# Patient Record
Sex: Female | Born: 1955
Health system: Southern US, Community
[De-identification: ages and names within clinical notes are randomized; demographics above are authoritative.]

## PROBLEM LIST (undated history)

## (undated) DIAGNOSIS — R112 Nausea with vomiting, unspecified: Secondary | ICD-10-CM

## (undated) DIAGNOSIS — C4490 Unspecified malignant neoplasm of skin, unspecified: Secondary | ICD-10-CM

## (undated) DIAGNOSIS — Z9889 Other specified postprocedural states: Secondary | ICD-10-CM

## (undated) DIAGNOSIS — M1990 Unspecified osteoarthritis, unspecified site: Secondary | ICD-10-CM

## (undated) DIAGNOSIS — Z87442 Personal history of urinary calculi: Secondary | ICD-10-CM

## (undated) DIAGNOSIS — J189 Pneumonia, unspecified organism: Secondary | ICD-10-CM

## (undated) DIAGNOSIS — G4730 Sleep apnea, unspecified: Secondary | ICD-10-CM

## (undated) DIAGNOSIS — Z8489 Family history of other specified conditions: Secondary | ICD-10-CM

## (undated) HISTORY — PX: BACK SURGERY: SHX140

## (undated) HISTORY — PX: TONSILLECTOMY: SUR1361

## (undated) HISTORY — PX: OTHER SURGICAL HISTORY: SHX169

---

## 2001-03-14 DIAGNOSIS — G4730 Sleep apnea, unspecified: Secondary | ICD-10-CM

## 2001-03-14 HISTORY — DX: Sleep apnea, unspecified: G47.30

## 2002-03-14 HISTORY — PX: NASAL SINUS SURGERY: SHX719

## 2004-03-14 HISTORY — PX: ABDOMINAL HYSTERECTOMY: SHX81

## 2004-03-14 HISTORY — PX: KNEE CARTILAGE SURGERY: SHX688

## 2014-03-14 HISTORY — PX: MENISCUS REPAIR: SHX5179

## 2017-03-14 HISTORY — PX: REPLACEMENT TOTAL KNEE: SUR1224

## 2018-11-26 ENCOUNTER — Other Ambulatory Visit: Payer: Self-pay | Admitting: Surgical

## 2018-11-26 DIAGNOSIS — Z96651 Presence of right artificial knee joint: Secondary | ICD-10-CM

## 2018-12-14 ENCOUNTER — Other Ambulatory Visit: Payer: Self-pay

## 2018-12-14 ENCOUNTER — Encounter (HOSPITAL_COMMUNITY)
Admission: RE | Admit: 2018-12-14 | Discharge: 2018-12-14 | Disposition: A | Discharge status: Home / Self Care | Payer: Medicare Other | Source: Ambulatory Visit | Attending: Surgical | Admitting: Surgical

## 2018-12-14 DIAGNOSIS — Z96651 Presence of right artificial knee joint: Secondary | ICD-10-CM

## 2018-12-14 MED ORDER — TECHNETIUM TC 99M MEDRONATE IV KIT
20.0000 | PACK | Freq: Once | INTRAVENOUS | Status: AC | PRN
  Administered 2018-12-14: 20 via INTRAVENOUS

## 2019-01-31 NOTE — Progress Notes (Signed)
PCP - Lanelle Bal PA Cardiologist -   Chest x-ray -  EKG - 02/04/2019 Stress Test -  ECHO -  Cardiac Cath - 2019 (done in [place])  Sleep Study - 2003 (done in [place]) CPAP - does not use  Fasting Blood Sugar -  Checks Blood Sugar _____ times a day  Blood Thinner Instructions: Aspirin Instructions: Last Dose:  Anesthesia review:   Patient denies shortness of breath, fever, cough and chest pain at PAT appointment   Patient verbalized understanding of instructions that were given to them at the PAT appointment. Patient was also instructed that they will need to review over the PAT instructions again at home before surgery.

## 2019-01-31 NOTE — Patient Instructions (Addendum)
DUE TO COVID-19 ONLY ONE VISITOR IS ALLOWED TO COME WITH YOU AND STAY IN THE WAITING ROOM ONLY DURING PRE OP AND PROCEDURE DAY OF SURGERY. THE 1 VISITOR MAY VISIT WITH YOU AFTER SURGERY IN YOUR PRIVATE ROOM DURING VISITING HOURS ONLY!  YOU NEED TO HAVE A COVID 19 TEST ON_Saturday 11/21/2020______ @_______ , THIS TEST MUST BE DONE BEFORE SURGERY, COME  Deanna Dorsey, [address] [place] , [address].  ([place]) ONCE YOUR COVID TEST IS COMPLETED, PLEASE BEGIN THE QUARANTINE INSTRUCTIONS AS OUTLINED IN YOUR HANDOUT.                Deanna Dorsey    Your procedure is scheduled on: Wednesday 02/06/2019   Report to [facility] Main  Entrance    Report to admitting at 1:20pm     Call this number if you have problems the morning of surgery [phone]    Remember: Do not eat food after Midnight.   BRUSH YOUR TEETH MORNING OF SURGERY AND RINSE YOUR MOUTH OUT, NO CHEWING GUM CANDY OR MINTS.  NO SOLID FOOD AFTER MIDNIGHT THE NIGHT PRIOR TO SURGERY. NOTHING BY MOUTH EXCEPT CLEAR LIQUIDS UNTIL 12:50 pm.   PLEASE FINISH ENSURE DRINK PER SURGEON ORDER  WHICH NEEDS TO BE COMPLETED AT 12:50 pm.   CLEAR LIQUID DIET   Foods Allowed                                                                     Foods Excluded  Coffee and tea, regular and decaf                             liquids that you cannot  Plain Jell-O any favor except red or purple             see through such as: Fruit ices (not with fruit pulp)                                     milk, soups, orange juice  Iced Popsicles                                    All solid food Carbonated beverages, regular and diet                                    Cranberry, grape and apple juices Sports drinks like Gatorade Lightly seasoned clear broth or consume(fat free) Sugar, honey syrup  Sample Menu Breakfast                                Lunch                                     Supper Cranberry juice  Beef broth                            Chicken broth Jell-O                                     Grape juice                           Apple juice Coffee or tea                        Jell-O                                      Popsicle                                                Coffee or tea                        Coffee or tea  _____________________________________________________________________       Take these medicines the morning of surgery with A SIP OF WATER: NONE                                You may not have any metal on your body including hair pins and              piercings  Do not wear jewelry, make-up, lotions, powders or perfumes, deodorant             Do not wear nail polish on your fingernails.  Do not shave  48 hours prior to surgery.                 Do not bring valuables to the hospital. [place].  Contacts, dentures or bridgework may not be worn into surgery.  Leave suitcase in the car. After surgery it may be brought to your room.                Please read over the following fact sheets you were given: _____________________________________________________________________             [facility] - Preparing for Surgery Before surgery, you can play an important role.  Because skin is not sterile, your skin needs to be as free of germs as possible.  You can reduce the number of germs on your skin by washing with CHG (chlorahexidine gluconate) soap before surgery.  CHG is an antiseptic cleaner which kills germs and bonds with the skin to continue killing germs even after washing. Please DO NOT use if you have an allergy to CHG or antibacterial soaps.  If your skin becomes reddened/irritated stop using the CHG and inform your nurse when you arrive at Short Stay. Do not shave (including legs and underarms) for at least 48 hours prior to the first CHG shower.  You may shave your face/neck. Please follow these  instructions carefully:  1.  Shower with CHG Soap the night before surgery and the  morning of Surgery.  2.  If you choose to wash your hair, wash your hair first as usual with your  normal  shampoo.  3.  After you shampoo, rinse your hair and body thoroughly to remove the  shampoo.                            4.  Use CHG as you would any other liquid soap.  You can apply chg directly  to the skin and wash                       Gently with a scrungie or clean washcloth.  5.  Apply the CHG Soap to your body ONLY FROM THE NECK DOWN.   Do not use on face/ open                           Wound or open sores. Avoid contact with eyes, ears mouth and genitals (private parts).                       Wash face,  Genitals (private parts) with your normal soap.             6.  Wash thoroughly, paying special attention to the area where your surgery  will be performed.  7.  Thoroughly rinse your body with warm water from the neck down.  8.  DO NOT shower/wash with your normal soap after using and rinsing off  the CHG Soap.                9.  Pat yourself dry with a clean towel.            10.  Wear clean pajamas.            11.  Place clean sheets on your bed the night of your first shower and do not  sleep with pets. Day of Surgery : Do not apply any lotions/deodorants the morning of surgery.  Please wear clean clothes to the hospital/surgery center.  FAILURE TO FOLLOW THESE INSTRUCTIONS MAY RESULT IN THE CANCELLATION OF YOUR SURGERY PATIENT SIGNATURE_________________________________  NURSE SIGNATURE__________________________________  ________________________________________________________________________   Adam Phenix  An incentive spirometer is a tool that can help keep your lungs clear and active. This tool measures how well you are filling your lungs with each breath. Taking long deep breaths may help reverse or decrease the chance of developing breathing (pulmonary) problems (especially  infection) following:  A long period of time when you are unable to move or be active. BEFORE THE PROCEDURE   If the spirometer includes an indicator to show your best effort, your nurse or respiratory therapist will set it to a desired goal.  If possible, sit up straight or lean slightly forward. Try not to slouch.  Hold the incentive spirometer in an upright position. INSTRUCTIONS FOR USE  1. Sit on the edge of your bed if possible, or sit up as far as you can in bed or on a chair. 2. Hold the incentive spirometer in an upright position. 3. Breathe out normally. 4. Place the mouthpiece in your mouth and seal your lips tightly around it. 5. Breathe in slowly and as deeply as possible, raising the piston or the ball toward the  top of the column. 6. Hold your breath for 3-5 seconds or for as long as possible. Allow the piston or ball to fall to the bottom of the column. 7. Remove the mouthpiece from your mouth and breathe out normally. 8. Rest for a few seconds and repeat Steps 1 through 7 at least 10 times every 1-2 hours when you are awake. Take your time and take a few normal breaths between deep breaths. 9. The spirometer may include an indicator to show your best effort. Use the indicator as a goal to work toward during each repetition. 10. After each set of 10 deep breaths, practice coughing to be sure your lungs are clear. If you have an incision (the cut made at the time of surgery), support your incision when coughing by placing a pillow or rolled up towels firmly against it. Once you are able to get out of bed, walk around indoors and cough well. You may stop using the incentive spirometer when instructed by your caregiver.  RISKS AND COMPLICATIONS  Take your time so you do not get dizzy or light-headed.  If you are in pain, you may need to take or ask for pain medication before doing incentive spirometry. It is harder to take a deep breath if you are having pain. AFTER  USE  Rest and breathe slowly and easily.  It can be helpful to keep track of a log of your progress. Your caregiver can provide you with a simple table to help with this. If you are using the spirometer at home, follow these instructions: [place] IF:   You are having difficultly using the spirometer.  You have trouble using the spirometer as often as instructed.  Your pain medication is not giving enough relief while using the spirometer.  You develop fever of 100.5 F (38.1 C) or higher. SEEK IMMEDIATE MEDICAL CARE IF:   You cough up bloody sputum that had not been present before.  You develop fever of 102 F (38.9 C) or greater.  You develop worsening pain at or near the incision site. MAKE SURE YOU:   Understand these instructions.  Will watch your condition.  Will get help right away if you are not doing well or get worse. Document Released: 07/11/2006 Document Revised: 05/23/2011 Document Reviewed: 09/11/2006 Exit[employer] Patient Information 2014 [employer], [place].   ________________________________________________________________________  WHAT IS A BLOOD TRANSFUSION? Blood Transfusion Information  A transfusion is the replacement of blood or some of its parts. Blood is made up of multiple cells which provide different functions.  Red blood cells carry oxygen and are used for blood loss replacement.  White blood cells fight against infection.  Platelets control bleeding.  Plasma helps clot blood.  Other blood products are available for specialized needs, such as hemophilia or other clotting disorders. BEFORE THE TRANSFUSION  Who gives blood for transfusions?   Healthy volunteers who are fully evaluated to make sure their blood is safe. This is blood bank blood. Transfusion therapy is the safest it has ever been in the practice of medicine. Before blood is taken from a donor, a complete history is taken to make sure that person has no history of diseases  nor engages in risky social behavior (examples are intravenous drug use or sexual activity with multiple partners). The donor's travel history is screened to minimize risk of transmitting infections, such as malaria. The donated blood is tested for signs of infectious diseases, such as HIV and hepatitis. The blood is then tested  to be sure it is compatible with you in order to minimize the chance of a transfusion reaction. If you or a relative donates blood, this is often done in anticipation of surgery and is not appropriate for emergency situations. It takes many days to process the donated blood. RISKS AND COMPLICATIONS Although transfusion therapy is very safe and saves many lives, the main dangers of transfusion include:   Getting an infectious disease.  Developing a transfusion reaction. This is an allergic reaction to something in the blood you were given. Every precaution is taken to prevent this. The decision to have a blood transfusion has been considered carefully by your caregiver before blood is given. Blood is not given unless the benefits outweigh the risks. AFTER THE TRANSFUSION  Right after receiving a blood transfusion, you will usually feel much better and more energetic. This is especially true if your red blood cells have gotten low (anemic). The transfusion raises the level of the red blood cells which carry oxygen, and this usually causes an energy increase.  The nurse administering the transfusion will monitor you carefully for complications. HOME CARE INSTRUCTIONS  No special instructions are needed after a transfusion. You may find your energy is better. Speak with your caregiver about any limitations on activity for underlying diseases you may have. SEEK MEDICAL CARE IF:   Your condition is not improving after your transfusion.  You develop redness or irritation at the intravenous (IV) site. SEEK IMMEDIATE MEDICAL CARE IF:  Any of the following symptoms occur over the  next 12 hours:  Shaking chills.  You have a temperature by mouth above 102 F (38.9 C), not controlled by medicine.  Chest, back, or muscle pain.  People around you feel you are not acting correctly or are confused.  Shortness of breath or difficulty breathing.  Dizziness and fainting.  You get a rash or develop hives.  You have a decrease in urine output.  Your urine turns a dark color or changes to pink, red, or brown. Any of the following symptoms occur over the next 10 days:  You have a temperature by mouth above 102 F (38.9 C), not controlled by medicine.  Shortness of breath.  Weakness after normal activity.  The white part of the eye turns yellow (jaundice).  You have a decrease in the amount of urine or are urinating less often.  Your urine turns a dark color or changes to pink, red, or brown. Document Released: 02/26/2000 Document Revised: 05/23/2011 Document Reviewed: 10/15/2007 [facility] Patient Information 2014 [place], [place].  _______________________________________________________________________

## 2019-02-02 ENCOUNTER — Other Ambulatory Visit (HOSPITAL_COMMUNITY)
Admission: RE | Admit: 2019-02-02 | Discharge: 2019-02-02 | Disposition: A | Discharge status: Home / Self Care | Payer: Medicare Other | Source: Ambulatory Visit | Attending: Orthopedic Surgery | Admitting: Orthopedic Surgery

## 2019-02-02 DIAGNOSIS — Z20828 Contact with and (suspected) exposure to other viral communicable diseases: Secondary | ICD-10-CM | POA: Insufficient documentation

## 2019-02-02 DIAGNOSIS — Z01812 Encounter for preprocedural laboratory examination: Secondary | ICD-10-CM | POA: Diagnosis present

## 2019-02-04 ENCOUNTER — Encounter (HOSPITAL_COMMUNITY)
Admission: RE | Admit: 2019-02-04 | Discharge: 2019-02-04 | Disposition: A | Discharge status: Home / Self Care | Payer: Medicare Other | Source: Ambulatory Visit | Attending: Orthopedic Surgery | Admitting: Orthopedic Surgery

## 2019-02-04 ENCOUNTER — Encounter (HOSPITAL_COMMUNITY): Payer: Self-pay

## 2019-02-04 ENCOUNTER — Other Ambulatory Visit: Payer: Self-pay

## 2019-02-04 DIAGNOSIS — Z01818 Encounter for other preprocedural examination: Secondary | ICD-10-CM | POA: Diagnosis not present

## 2019-02-04 DIAGNOSIS — R9431 Abnormal electrocardiogram [ECG] [EKG]: Secondary | ICD-10-CM | POA: Diagnosis not present

## 2019-02-04 DIAGNOSIS — M24661 Ankylosis, right knee: Secondary | ICD-10-CM | POA: Insufficient documentation

## 2019-02-04 HISTORY — DX: Other specified postprocedural states: Z98.890

## 2019-02-04 HISTORY — DX: Sleep apnea, unspecified: G47.30

## 2019-02-04 HISTORY — DX: Unspecified osteoarthritis, unspecified site: M19.90

## 2019-02-04 HISTORY — DX: Other specified postprocedural states: R11.2

## 2019-02-04 HISTORY — DX: Personal history of urinary calculi: Z87.442

## 2019-02-04 LAB — COMPREHENSIVE METABOLIC PANEL
ALT: 20 U/L (ref 0–44)
AST: 19 U/L (ref 15–41)
Albumin: 4.2 g/dL (ref 3.5–5.0)
Alkaline Phosphatase: 54 U/L (ref 38–126)
Anion gap: 7 (ref 5–15)
BUN: 15 mg/dL (ref 8–23)
CO2: 27 mmol/L (ref 22–32)
Calcium: 9.3 mg/dL (ref 8.9–10.3)
Chloride: 106 mmol/L (ref 98–111)
Creatinine, Ser: 0.59 mg/dL (ref 0.44–1.00)
GFR calc Af Amer: >60 mL/min (ref >60)
GFR calc non Af Amer: >60 mL/min (ref >60)
Glucose, Bld: 91 mg/dL (ref 70–99)
Potassium: 4 mmol/L (ref 3.5–5.1)
Sodium: 140 mmol/L (ref 135–145)
Total Bilirubin: 0.6 mg/dL (ref 0.3–1.2)
Total Protein: 7.2 g/dL (ref 6.5–8.1)

## 2019-02-04 LAB — CBC WITH DIFFERENTIAL/PLATELET
Abs Immature Granulocytes: 0.02 10*3/uL (ref 0.00–0.07)
Basophils Absolute: 0 10*3/uL (ref 0.0–0.1)
Basophils Relative: 1 %
Eosinophils Absolute: 0.1 10*3/uL (ref 0.0–0.5)
Eosinophils Relative: 2 %
HCT: 37.7 % (ref 36.0–46.0)
Hemoglobin: 12.3 g/dL (ref 12.0–15.0)
Immature Granulocytes: 1 %
Lymphocytes Relative: 26 %
Lymphs Abs: 1.1 10*3/uL (ref 0.7–4.0)
MCH: 31.6 pg (ref 26.0–34.0)
MCHC: 32.6 g/dL (ref 30.0–36.0)
MCV: 96.9 fL (ref 80.0–100.0)
Monocytes Absolute: 0.3 10*3/uL (ref 0.1–1.0)
Monocytes Relative: 7 %
Neutro Abs: 2.8 10*3/uL (ref 1.7–7.7)
Neutrophils Relative %: 63 %
Platelets: 310 10*3/uL (ref 150–400)
RBC: 3.89 MIL/uL (ref 3.87–5.11)
RDW: 13.2 % (ref 11.5–15.5)
WBC: 4.3 10*3/uL (ref 4.0–10.5)
nRBC: 0 % (ref 0.0–0.2)

## 2019-02-04 LAB — PROTIME-INR
INR: 1 (ref 0.8–1.2)
Prothrombin Time: 12.7 seconds (ref 11.4–15.2)

## 2019-02-04 LAB — NOVEL CORONAVIRUS, NAA (HOSP ORDER, SEND-OUT TO REF LAB; TAT 18-24 HRS): SARS-CoV-2, NAA: NOT DETECTED

## 2019-02-04 LAB — ABO/RH: ABO/RH(D): A POS

## 2019-02-04 LAB — SURGICAL PCR SCREEN
MRSA, PCR: NEGATIVE
Staphylococcus aureus: POSITIVE — AB

## 2019-02-04 LAB — APTT: aPTT: 27 seconds (ref 24–36)

## 2019-02-05 MED ORDER — BUPIVACAINE LIPOSOME 1.3 % IJ SUSP
20.0000 mL | INTRAMUSCULAR | Status: DC
  Filled 2019-02-05: qty 20

## 2019-02-06 ENCOUNTER — Observation Stay (HOSPITAL_COMMUNITY)
Admission: RE | Admit: 2019-02-06 | Discharge: 2019-02-07 | Disposition: A | Discharge status: Home / Self Care | Payer: Medicare Other | Attending: Orthopedic Surgery | Admitting: Orthopedic Surgery

## 2019-02-06 ENCOUNTER — Encounter (HOSPITAL_COMMUNITY)
Admission: RE | Disposition: A | Discharge status: Home / Self Care | Payer: Self-pay | Source: Home / Self Care | Attending: Orthopedic Surgery

## 2019-02-06 ENCOUNTER — Ambulatory Visit (HOSPITAL_COMMUNITY): Payer: Medicare Other | Admitting: Physician Assistant

## 2019-02-06 ENCOUNTER — Encounter (HOSPITAL_COMMUNITY): Payer: Self-pay

## 2019-02-06 ENCOUNTER — Other Ambulatory Visit: Payer: Self-pay

## 2019-02-06 ENCOUNTER — Ambulatory Visit (HOSPITAL_COMMUNITY): Payer: Medicare Other | Admitting: Certified Registered Nurse Anesthetist

## 2019-02-06 DIAGNOSIS — M1990 Unspecified osteoarthritis, unspecified site: Secondary | ICD-10-CM | POA: Insufficient documentation

## 2019-02-06 DIAGNOSIS — Z881 Allergy status to other antibiotic agents status: Secondary | ICD-10-CM | POA: Insufficient documentation

## 2019-02-06 DIAGNOSIS — M24661 Ankylosis, right knee: Secondary | ICD-10-CM | POA: Diagnosis present

## 2019-02-06 DIAGNOSIS — Z96651 Presence of right artificial knee joint: Secondary | ICD-10-CM | POA: Diagnosis not present

## 2019-02-06 DIAGNOSIS — G4730 Sleep apnea, unspecified: Secondary | ICD-10-CM | POA: Insufficient documentation

## 2019-02-06 DIAGNOSIS — Z79899 Other long term (current) drug therapy: Secondary | ICD-10-CM | POA: Diagnosis not present

## 2019-02-06 DIAGNOSIS — E785 Hyperlipidemia, unspecified: Secondary | ICD-10-CM | POA: Diagnosis not present

## 2019-02-06 DIAGNOSIS — T8482XA Fibrosis due to internal orthopedic prosthetic devices, implants and grafts, initial encounter: Secondary | ICD-10-CM | POA: Diagnosis present

## 2019-02-06 HISTORY — PX: KNEE ARTHROTOMY: SHX5881

## 2019-02-06 LAB — TYPE AND SCREEN
ABO/RH(D): A POS
Antibody Screen: NEGATIVE

## 2019-02-06 SURGERY — ARTHROTOMY, KNEE
Anesthesia: Regional | Site: Knee | Laterality: Right

## 2019-02-06 MED ORDER — PROMETHAZINE HCL 25 MG/ML IJ SOLN
6.2500 mg | INTRAMUSCULAR | Status: DC | PRN
  Administered 2019-02-06: 17:00:00 6.25 mg via INTRAVENOUS

## 2019-02-06 MED ORDER — ROPIVACAINE HCL 5 MG/ML IJ SOLN
INTRAMUSCULAR | Status: DC | PRN
  Administered 2019-02-06: 30 mL via PERINEURAL

## 2019-02-06 MED ORDER — PROPOFOL 10 MG/ML IV BOLUS
INTRAVENOUS | Status: AC
  Filled 2019-02-06: qty 20

## 2019-02-06 MED ORDER — METOCLOPRAMIDE HCL 5 MG/ML IJ SOLN: 5.0000 mg | Freq: Three times a day (TID) | INTRAMUSCULAR | Status: DC | PRN

## 2019-02-06 MED ORDER — TRAMADOL HCL 50 MG PO TABS
50.0000 mg | ORAL_TABLET | Freq: Four times a day (QID) | ORAL | Status: DC | PRN
  Administered 2019-02-07: 100 mg via ORAL
  Filled 2019-02-06: qty 2

## 2019-02-06 MED ORDER — OXYCODONE HCL 5 MG/5ML PO SOLN: 5.0000 mg | Freq: Once | ORAL | Status: DC | PRN

## 2019-02-06 MED ORDER — TRAMADOL HCL 50 MG PO TABS: 50.0000 mg | ORAL_TABLET | Freq: Four times a day (QID) | ORAL | 0 refills | Status: DC | PRN

## 2019-02-06 MED ORDER — LIDOCAINE 2% (20 MG/ML) 5 ML SYRINGE
INTRAMUSCULAR | Status: AC
  Filled 2019-02-06: qty 5

## 2019-02-06 MED ORDER — HYDROMORPHONE HCL 1 MG/ML IJ SOLN
0.2500 mg | INTRAMUSCULAR | Status: DC | PRN
  Administered 2019-02-06 (×2): 0.5 mg via INTRAVENOUS

## 2019-02-06 MED ORDER — METHOCARBAMOL 500 MG PO TABS: 500.0000 mg | ORAL_TABLET | Freq: Four times a day (QID) | ORAL | 0 refills | Status: DC | PRN

## 2019-02-06 MED ORDER — PROMETHAZINE HCL 25 MG/ML IJ SOLN
INTRAMUSCULAR | Status: AC
  Administered 2019-02-06: 6.25 mg via INTRAVENOUS
  Filled 2019-02-06: qty 1

## 2019-02-06 MED ORDER — CHLORHEXIDINE GLUCONATE 4 % EX LIQD: 60.0000 mL | Freq: Once | CUTANEOUS | Status: DC

## 2019-02-06 MED ORDER — MEPERIDINE HCL 50 MG/ML IJ SOLN: 6.2500 mg | INTRAMUSCULAR | Status: DC | PRN

## 2019-02-06 MED ORDER — ONDANSETRON HCL 4 MG PO TABS
4.0000 mg | ORAL_TABLET | Freq: Four times a day (QID) | ORAL | Status: DC | PRN
  Administered 2019-02-07: 4 mg via ORAL

## 2019-02-06 MED ORDER — CEFAZOLIN SODIUM-DEXTROSE 1-4 GM/50ML-% IV SOLN
1.0000 g | Freq: Once | INTRAVENOUS | Status: AC
  Administered 2019-02-06: 1 g via INTRAVENOUS
  Filled 2019-02-06: qty 50

## 2019-02-06 MED ORDER — FENTANYL CITRATE (PF) 100 MCG/2ML IJ SOLN
INTRAMUSCULAR | Status: AC
  Filled 2019-02-06: qty 2

## 2019-02-06 MED ORDER — HYDROMORPHONE HCL 1 MG/ML IJ SOLN
INTRAMUSCULAR | Status: AC
  Administered 2019-02-06: 0.5 mg via INTRAVENOUS
  Filled 2019-02-06: qty 1

## 2019-02-06 MED ORDER — KETOROLAC TROMETHAMINE 30 MG/ML IJ SOLN
30.0000 mg | Freq: Once | INTRAMUSCULAR | Status: AC | PRN
  Administered 2019-02-06: 16:00:00 30 mg via INTRAVENOUS

## 2019-02-06 MED ORDER — FENTANYL CITRATE (PF) 100 MCG/2ML IJ SOLN
INTRAMUSCULAR | Status: DC | PRN
  Administered 2019-02-06 (×2): 25 ug via INTRAVENOUS
  Administered 2019-02-06: 50 ug via INTRAVENOUS
  Administered 2019-02-06: 25 ug via INTRAVENOUS
  Administered 2019-02-06 (×2): 50 ug via INTRAVENOUS

## 2019-02-06 MED ORDER — MIDAZOLAM HCL 5 MG/5ML IJ SOLN
INTRAMUSCULAR | Status: DC | PRN
  Administered 2019-02-06: 1 mg via INTRAVENOUS
  Administered 2019-02-06: 2 mg via INTRAVENOUS
  Administered 2019-02-06: 1 mg via INTRAVENOUS

## 2019-02-06 MED ORDER — MIDAZOLAM HCL 2 MG/2ML IJ SOLN
INTRAMUSCULAR | Status: AC
  Filled 2019-02-06: qty 2

## 2019-02-06 MED ORDER — LIDOCAINE 2% (20 MG/ML) 5 ML SYRINGE
INTRAMUSCULAR | Status: DC | PRN
  Administered 2019-02-06: 80 mg via INTRAVENOUS

## 2019-02-06 MED ORDER — ACETAMINOPHEN 10 MG/ML IV SOLN
1000.0000 mg | Freq: Four times a day (QID) | INTRAVENOUS | Status: DC
  Administered 2019-02-06: 1000 mg via INTRAVENOUS
  Filled 2019-02-06: qty 100

## 2019-02-06 MED ORDER — ATORVASTATIN CALCIUM 10 MG PO TABS
10.0000 mg | ORAL_TABLET | Freq: Every day | ORAL | Status: DC
  Administered 2019-02-06: 10 mg via ORAL
  Filled 2019-02-06: qty 1

## 2019-02-06 MED ORDER — METHOCARBAMOL 500 MG IVPB - SIMPLE MED
500.0000 mg | Freq: Four times a day (QID) | INTRAVENOUS | Status: DC | PRN
  Administered 2019-02-06: 16:00:00 500 mg via INTRAVENOUS
  Filled 2019-02-06: qty 50

## 2019-02-06 MED ORDER — DEXMEDETOMIDINE HCL 200 MCG/2ML IV SOLN
INTRAVENOUS | Status: DC | PRN
  Administered 2019-02-06 (×2): 4 ug via INTRAVENOUS

## 2019-02-06 MED ORDER — KETOROLAC TROMETHAMINE 30 MG/ML IJ SOLN
INTRAMUSCULAR | Status: AC
  Administered 2019-02-06: 30 mg via INTRAVENOUS
  Filled 2019-02-06: qty 1

## 2019-02-06 MED ORDER — PROPOFOL 500 MG/50ML IV EMUL
INTRAVENOUS | Status: AC
  Filled 2019-02-06: qty 100

## 2019-02-06 MED ORDER — HYDROCODONE-ACETAMINOPHEN 5-325 MG PO TABS
1.0000 | ORAL_TABLET | ORAL | Status: DC | PRN
  Administered 2019-02-06: 1 via ORAL
  Administered 2019-02-07: 2 via ORAL
  Filled 2019-02-06: qty 1
  Filled 2019-02-06: qty 2

## 2019-02-06 MED ORDER — PROPOFOL 10 MG/ML IV BOLUS
INTRAVENOUS | Status: DC | PRN
  Administered 2019-02-06: 150 mg via INTRAVENOUS
  Administered 2019-02-06: 50 mg via INTRAVENOUS

## 2019-02-06 MED ORDER — DEXAMETHASONE SODIUM PHOSPHATE 10 MG/ML IJ SOLN: 8.0000 mg | Freq: Once | INTRAMUSCULAR | Status: DC

## 2019-02-06 MED ORDER — ASPIRIN 325 MG PO TBEC: 325.0000 mg | DELAYED_RELEASE_TABLET | Freq: Every day | ORAL | 0 refills | Status: AC

## 2019-02-06 MED ORDER — ASPIRIN EC 325 MG PO TBEC
325.0000 mg | DELAYED_RELEASE_TABLET | Freq: Every day | ORAL | Status: DC
  Administered 2019-02-07: 325 mg via ORAL
  Filled 2019-02-06: qty 1

## 2019-02-06 MED ORDER — METOCLOPRAMIDE HCL 5 MG PO TABS: 5.0000 mg | ORAL_TABLET | Freq: Three times a day (TID) | ORAL | Status: DC | PRN

## 2019-02-06 MED ORDER — LACTATED RINGERS IV SOLN
INTRAVENOUS | Status: DC
  Administered 2019-02-06: 14:00:00 via INTRAVENOUS

## 2019-02-06 MED ORDER — ONDANSETRON HCL 4 MG/2ML IJ SOLN
INTRAMUSCULAR | Status: DC | PRN
  Administered 2019-02-06: 4 mg via INTRAVENOUS

## 2019-02-06 MED ORDER — ACETAMINOPHEN 500 MG PO TABS: 1000.0000 mg | ORAL_TABLET | Freq: Once | ORAL | Status: DC

## 2019-02-06 MED ORDER — METHOCARBAMOL 500 MG PO TABS
500.0000 mg | ORAL_TABLET | Freq: Four times a day (QID) | ORAL | Status: DC | PRN
  Administered 2019-02-07: 500 mg via ORAL
  Filled 2019-02-06 (×2): qty 1

## 2019-02-06 MED ORDER — CEFAZOLIN SODIUM-DEXTROSE 2-4 GM/100ML-% IV SOLN
2.0000 g | INTRAVENOUS | Status: AC
  Administered 2019-02-06: 2 g via INTRAVENOUS
  Filled 2019-02-06: qty 100

## 2019-02-06 MED ORDER — ACETAMINOPHEN 500 MG PO TABS
500.0000 mg | ORAL_TABLET | Freq: Four times a day (QID) | ORAL | Status: DC
  Administered 2019-02-06 – 2019-02-07 (×2): 500 mg via ORAL
  Filled 2019-02-06 (×2): qty 1

## 2019-02-06 MED ORDER — ONDANSETRON HCL 4 MG/2ML IJ SOLN: 4.0000 mg | Freq: Four times a day (QID) | INTRAMUSCULAR | Status: DC | PRN

## 2019-02-06 MED ORDER — OXYCODONE HCL 5 MG PO TABS: 5.0000 mg | ORAL_TABLET | Freq: Once | ORAL | Status: DC | PRN

## 2019-02-06 MED ORDER — DEXAMETHASONE SODIUM PHOSPHATE 10 MG/ML IJ SOLN
INTRAMUSCULAR | Status: DC | PRN
  Administered 2019-02-06: 10 mg

## 2019-02-06 MED ORDER — MORPHINE SULFATE (PF) 2 MG/ML IV SOLN: 0.5000 mg | INTRAVENOUS | Status: DC | PRN

## 2019-02-06 MED ORDER — SODIUM CHLORIDE 0.9 % IV SOLN
INTRAVENOUS | Status: DC
  Administered 2019-02-06: 1000 mL via INTRAVENOUS
  Administered 2019-02-07: 06:00:00 via INTRAVENOUS

## 2019-02-06 MED ORDER — ONDANSETRON HCL 4 MG/2ML IJ SOLN
INTRAMUSCULAR | Status: AC
  Filled 2019-02-06: qty 2

## 2019-02-06 MED ORDER — POVIDONE-IODINE 10 % EX SWAB
2.0000 "application " | Freq: Once | CUTANEOUS | Status: AC
  Administered 2019-02-06: 2 via TOPICAL

## 2019-02-06 MED ORDER — DEXAMETHASONE SODIUM PHOSPHATE 10 MG/ML IJ SOLN
INTRAMUSCULAR | Status: AC
  Filled 2019-02-06: qty 1

## 2019-02-06 MED ORDER — TRANEXAMIC ACID-NACL 1000-0.7 MG/100ML-% IV SOLN
1000.0000 mg | INTRAVENOUS | Status: AC
  Administered 2019-02-06: 1000 mg via INTRAVENOUS
  Filled 2019-02-06: qty 100

## 2019-02-06 MED ORDER — SCOPOLAMINE 1 MG/3DAYS TD PT72
1.0000 | MEDICATED_PATCH | TRANSDERMAL | Status: DC
  Administered 2019-02-06: 1.5 mg via TRANSDERMAL
  Filled 2019-02-06: qty 1

## 2019-02-06 MED ORDER — HYDROCODONE-ACETAMINOPHEN 5-325 MG PO TABS: 1.0000 | ORAL_TABLET | Freq: Four times a day (QID) | ORAL | 0 refills | Status: DC | PRN

## 2019-02-06 MED ORDER — DOCUSATE SODIUM 100 MG PO CAPS
100.0000 mg | ORAL_CAPSULE | Freq: Two times a day (BID) | ORAL | Status: DC
  Administered 2019-02-06 – 2019-02-07 (×2): 100 mg via ORAL
  Filled 2019-02-06 (×2): qty 1

## 2019-02-06 MED ORDER — METHOCARBAMOL 500 MG IVPB - SIMPLE MED
INTRAVENOUS | Status: AC
  Administered 2019-02-06: 500 mg via INTRAVENOUS
  Filled 2019-02-06: qty 50

## 2019-02-06 MED ORDER — SODIUM CHLORIDE 0.9 % IR SOLN
Status: DC | PRN
  Administered 2019-02-06: 1000 mL

## 2019-02-06 MED ORDER — PROPOFOL 500 MG/50ML IV EMUL
INTRAVENOUS | Status: DC | PRN
  Administered 2019-02-06: 150 ug/kg/min via INTRAVENOUS

## 2019-02-06 SURGICAL SUPPLY — 33 items
BAG ZIPLOCK 12X15 (MISCELLANEOUS) ×2 IMPLANT
BNDG ELASTIC 6X5.8 VLCR STR LF (GAUZE/BANDAGES/DRESSINGS) ×2 IMPLANT
COVER SURGICAL LIGHT HANDLE (MISCELLANEOUS) ×2 IMPLANT
COVER WAND RF STERILE (DRAPES) IMPLANT
CUFF TOURN SGL QUICK 34 (TOURNIQUET CUFF)
CUFF TRNQT CYL 34X4.125X (TOURNIQUET CUFF) IMPLANT
DRAPE U-SHAPE 47X51 STRL (DRAPES) ×2 IMPLANT
DRSG ADAPTIC 3X8 NADH LF (GAUZE/BANDAGES/DRESSINGS) ×2 IMPLANT
DRSG PAD ABDOMINAL 8X10 ST (GAUZE/BANDAGES/DRESSINGS) ×2 IMPLANT
DURAPREP 26ML APPLICATOR (WOUND CARE) ×2 IMPLANT
ELECT REM PT RETURN 15FT ADLT (MISCELLANEOUS) ×2 IMPLANT
EVACUATOR 1/8 PVC DRAIN (DRAIN) ×2 IMPLANT
GAUZE SPONGE 4X4 12PLY STRL (GAUZE/BANDAGES/DRESSINGS) ×2 IMPLANT
GLOVE BIO SURGEON STRL SZ7 (GLOVE) ×2 IMPLANT
GLOVE BIO SURGEON STRL SZ8 (GLOVE) ×2 IMPLANT
GLOVE BIOGEL PI IND STRL 7.0 (GLOVE) ×1 IMPLANT
GLOVE BIOGEL PI IND STRL 8 (GLOVE) ×3 IMPLANT
GLOVE BIOGEL PI INDICATOR 7.0 (GLOVE) ×1
GLOVE BIOGEL PI INDICATOR 8 (GLOVE) ×3
GOWN STRL REUS W/TWL LRG LVL3 (GOWN DISPOSABLE) ×6 IMPLANT
KIT TURNOVER KIT A (KITS) IMPLANT
MANIFOLD NEPTUNE II (INSTRUMENTS) ×2 IMPLANT
PACK TOTAL KNEE CUSTOM (KITS) ×2 IMPLANT
PADDING CAST COTTON 6X4 STRL (CAST SUPPLIES) ×2 IMPLANT
PENCIL SMOKE EVACUATOR (MISCELLANEOUS) IMPLANT
PROTECTOR NERVE ULNAR (MISCELLANEOUS) ×2 IMPLANT
STRIP CLOSURE SKIN 1/2X4 (GAUZE/BANDAGES/DRESSINGS) ×2 IMPLANT
SUT MNCRL AB 4-0 PS2 18 (SUTURE) ×2 IMPLANT
SUT STRATAFIX 0 PDS 27 VIOLET (SUTURE) ×2
SUT VIC AB 2-0 CT1 27 (SUTURE) ×2
SUT VIC AB 2-0 CT1 TAPERPNT 27 (SUTURE) ×2 IMPLANT
SUTURE STRATFX 0 PDS 27 VIOLET (SUTURE) ×1 IMPLANT
WRAP KNEE MAXI GEL POST OP (GAUZE/BANDAGES/DRESSINGS) ×2 IMPLANT

## 2019-02-06 NOTE — Anesthesia Procedure Notes (Addendum)
Procedure Name: LMA Insertion Date/Time: 02/06/2019 3:12 PM Performed by: West Pugh, CRNA Pre-anesthesia Checklist: Patient identified, Emergency Drugs available, Suction available, Patient being monitored and Timeout performed Patient Re-evaluated:Patient Re-evaluated prior to induction Oxygen Delivery Method: Circle system utilized Preoxygenation: Pre-oxygenation with 100% oxygen Induction Type: IV induction LMA: LMA with gastric port inserted LMA Size: 4.0 Number of attempts: 1 Placement Confirmation: positive ETCO2 and breath sounds checked- equal and bilateral Tube secured with: Tape Dental Injury: Teeth and Oropharynx as per pre-operative assessment

## 2019-02-06 NOTE — Anesthesia Postprocedure Evaluation (Signed)
Anesthesia Post Note  Patient: Deanna Dorsey  Procedure(s) Performed: KNEE ARTHROTOMY; SCAR EXCISION (Right Knee)     Patient location during evaluation: PACU Anesthesia Type: Regional and General Level of consciousness: awake and alert, oriented and patient cooperative Pain management: pain level controlled Vital Signs Assessment: post-procedure vital signs reviewed and stable Respiratory status: spontaneous breathing, nonlabored ventilation and respiratory function stable Cardiovascular status: blood pressure returned to baseline and stable Postop Assessment: no apparent nausea or vomiting Anesthetic complications: no    Last Vitals:  Vitals:   02/06/19 1437 02/06/19 1615  BP: 132/78 129/78  Pulse: 73 64  Resp: 18 13  Temp:  36.4 C  SpO2:  100%    Last Pain:  Vitals:   02/06/19 1615  TempSrc:   PainSc: 0-No pain                 Pervis Hocking

## 2019-02-06 NOTE — Addendum Note (Signed)
Addendum  created 02/06/19 1706 by West Pugh, CRNA   Clinical Note Signed, Intraprocedure Blocks edited, LDA updated via procedure documentation

## 2019-02-06 NOTE — Addendum Note (Signed)
Addendum  created 02/06/19 1708 by West Pugh, CRNA   Intraprocedure Meds edited

## 2019-02-06 NOTE — H&P (Signed)
CC- Deanna Dorsey is a 63 y.o. female who presents with right knee stiffness.  HPI- . Knee Pain: Patient presents with stiffness involving the  right knee. Onset of the symptoms was approximately a year ago. Inciting event: She had a right Total Knee Arthroplasty performed in 2019 and has had persistent stiffness since despite physical therapy. Her range of mtion limits what she can do and she desires more motion. I saw her in second opiniion a few months ago and discussed treatment options. We discussed open arthrotomy and scar excision as an option and she agreed to that. Current symptoms include stiffness.  Past Medical History:  Diagnosis Date  . Arthritis   . History of kidney stones    1982  . PONV (postoperative nausea and vomiting)   . Sleep apnea     Past Surgical History:  Procedure Laterality Date  . ABDOMINAL HYSTERECTOMY  2006  . BACK SURGERY     2016  . KNEE CARTILAGE SURGERY Right 2006  . MENISCUS REPAIR Right 2016  . NASAL SINUS SURGERY  2004  . REPLACEMENT TOTAL KNEE Right 2019  . TONSILLECTOMY     2004    Prior to Admission medications   Medication Sig Start Date End Date Taking? Authorizing Provider  atorvastatin (LIPITOR) 10 MG tablet Take 10 mg by mouth at bedtime.  01/14/19  Yes [provider]   Right knee exam antalgic gait, no warmth or effusion, reduced range of motion ( 5-90), collateral ligaments intact  Physical Examination: General appearance - alert, well appearing, and in no distress Mental status - alert, oriented to person, place, and time Chest - clear to auscultation, no wheezes, rales or rhonchi, symmetric air entry Heart - normal rate, regular rhythm, normal S1, S2, no murmurs, rubs, clicks or gallops Abdomen - soft, nontender, nondistended, no masses or organomegaly Neurological - alert, oriented, normal speech, no focal findings or movement disorder noted   Asessment/Plan---Right knee arthrofibrosis- - Plan right knee arthrotomy  with scar excision. Procedure risks and potential comps discussed with patient who elects to proceed. Goals are decreased pain and increased function with a high likelihood of achieving both

## 2019-02-06 NOTE — Plan of Care (Signed)
  Problem: Education: Goal: Required Educational Video(s) Outcome: Progressing   Problem: Clinical Measurements: Goal: Postoperative complications will be avoided or minimized Outcome: Progressing   Problem: Skin Integrity: Goal: Demonstration of wound healing without infection will improve Outcome: Progressing   

## 2019-02-06 NOTE — Progress Notes (Signed)
Orthopedic Tech Progress Note Patient Details:  Deanna Dorsey September 27, 1955 [id number] Assisted by Sander Nephew. CPM Right Knee CPM Right Knee: On Right Knee Flexion (Degrees): 40 Right Knee Extension (Degrees): 10 Additional Comments: 4 hours per day  Post Interventions Patient Tolerated: Well Instructions Provided: Adjustment of device, Care of device  Ladell Pier [facility] 02/06/2019, 4:51 PM

## 2019-02-06 NOTE — Transfer of Care (Signed)
Immediate Anesthesia Transfer of Care Note  Patient: Deanna Dorsey  Procedure(s) Performed: KNEE ARTHROTOMY; SCAR EXCISION (Right Knee)  Patient Location: PACU  Anesthesia Type:GA combined with regional for post-op pain  Level of Consciousness: awake, alert  and patient cooperative  Airway & Oxygen Therapy: Patient Spontanous Breathing and Patient connected to face mask oxygen  Post-op Assessment: Report given to RN and Post -op Vital signs reviewed and stable  Post vital signs: Reviewed and stable  Last Vitals:  Vitals Value Taken Time  BP    Temp    Pulse    Resp    SpO2      Last Pain:  Vitals:   02/06/19 1323  TempSrc: Oral  PainSc:       Patients Stated Pain Goal: 1 ([id number])  Complications: No apparent anesthesia complications

## 2019-02-06 NOTE — Anesthesia Preprocedure Evaluation (Addendum)
Anesthesia Evaluation  Patient identified by MRN, date of birth, ID band Patient awake    Reviewed: Allergy & Precautions, NPO status , Patient's Chart, lab work & pertinent test results  History of Anesthesia Complications (+) PONV and history of anesthetic complications  Airway Mallampati: II  TM Distance: >3 FB Neck ROM: Full    Dental no notable dental hx. (+) Teeth Intact, Dental Advisory Given   Pulmonary sleep apnea ,    Pulmonary exam normal breath sounds clear to auscultation       Cardiovascular negative cardio ROS Normal cardiovascular exam Rhythm:Regular Rate:Normal     Neuro/Psych negative neurological ROS  negative psych ROS   GI/Hepatic negative GI ROS, Neg liver ROS,   Endo/Other  negative endocrine ROS  Renal/GU negative Renal ROS  negative genitourinary   Musculoskeletal  (+) Arthritis , Osteoarthritis,  Arthrofibrosis right knee   Abdominal Normal abdominal exam  (+)   Peds negative pediatric ROS (+)  Hematology negative hematology ROS (+)   Anesthesia Other Findings HLD  Reproductive/Obstetrics negative OB ROS                            Anesthesia Physical Anesthesia Plan  ASA: II  Anesthesia Plan: General and Regional   Post-op Pain Management: GA combined w/ Regional for post-op pain   Induction: Intravenous  PONV Risk Score and Plan: 3 and Ondansetron, Dexamethasone, Midazolam, Treatment may vary due to age or medical condition, TIVA, Propofol infusion and Scopolamine patch - Pre-op  Airway Management Planned: LMA  Additional Equipment: None  Intra-op Plan:   Post-operative Plan: Extubation in OR  Informed Consent: I have reviewed the patients History and Physical, chart, labs and discussed the procedure including the risks, benefits and alternatives for the proposed anesthesia with the patient or authorized representative who has indicated his/her  understanding and acceptance.     Dental advisory given  Plan Discussed with: CRNA  Anesthesia Plan Comments: (Severe PONV but refusing spinal/MAC. Will do adductor canal block for postop pain, GA/LMA with TIVA to prophylax PONV)     Anesthesia Quick Evaluation

## 2019-02-06 NOTE — Brief Op Note (Signed)
02/06/2019  3:59 PM  PATIENT:  Aireana Knudson  63 y.o. female  PRE-OPERATIVE DIAGNOSIS:  arthrofibrosis right knee  POST-OPERATIVE DIAGNOSIS:  arthrofibrosis right knee  PROCEDURE:  Procedure(s) with comments: KNEE ARTHROTOMY; SCAR EXCISION (Right) - 77min  SURGEON:  Surgeon(s) and Role:    Gaynelle Arabian, MD - Primary  PHYSICIAN ASSISTANT:   ASSISTANTS: Theresa Duty, PA-C   ANESTHESIA:   general  EBL:  10 ml  BLOOD ADMINISTERED:none  DRAINS: (Medium) Hemovact drain(s) in the right knee with  Suction Open   LOCAL MEDICATIONS USED:  NONE  COUNTS:  YES  TOURNIQUET:   Total Tourniquet Time Documented: Thigh (Right) - 13 minutes Total: Thigh (Right) - 13 minutes   DICTATION: .Other Dictation: Dictation Number [id number]  PLAN OF CARE: Admit for overnight observation  PATIENT DISPOSITION:  PACU - hemodynamically stable.

## 2019-02-06 NOTE — Op Note (Signed)
NAME: KRISTAL, BUDZ MEDICAL RECORD [id number] ACCOUNT [id number] DATE OF BIRTH:Jan 08, 1956 FACILITY: [facility] LOCATION: [facility] PHYSICIAN:Mikelle Myrick Zella Ball, MD  OPERATIVE REPORT  DATE OF PROCEDURE:  02/06/2019  PREOPERATIVE DIAGNOSIS:  Arthrofibrosis, right knee, status post total knee arthroplasty.  POSTOPERATIVE DIAGNOSIS:  Arthrofibrosis, right knee, status post total knee arthroplasty.  PROCEDURE:  Right knee arthrotomy and scar excision.  SURGEON:  Gaynelle Arabian, MD  ASSISTANT:  Theresa Duty, PA-C  ANESTHESIA:  General.  ESTIMATED BLOOD LOSS:  Minimal.  DRAINS:  Hemovac x1.  COMPLICATIONS:  None.  TOURNIQUET TIME:  13 minutes at 300 mmHg.  CONDITION:  Stable to recovery.  BRIEF CLINICAL NOTE:  The patient is a 63 year old female who underwent a right total knee arthroplasty in 2019 in [place].  She has developed significant stiffness in the knee, which has not responded to physical therapy.  I saw her in the office  approximately 2 months ago.  Range of motion was noted to be flexion of 95 degrees with a firm endpoint and she was within 5 degrees of full extension.  She had extensive physical therapy and I was unable to get motion past this point.  We discussed  treatment options and we decided to pursue arthrotomy and scar excision.  PROCEDURE IN DETAIL:  After successful administration of general anesthetic, tourniquet was placed high on the right thigh.  Right lower extremity prepped and draped in the usual sterile fashion.  Exam under anesthesia showed range of motion 5-95 degrees  with firm endpoint.  The leg was then wrapped in Esmarch, knee flexed and tourniquet inflated to 300 mmHg.  Midline incision was made with a 10 blade through subcutaneous tissue to the level of the extensor mechanism.  A fresh blade was used to make a  medial parapatellar arthrotomy.  We did not encounter fluid in the joint.  Soft tissue over the proximal medial tibia subperiosteally  elevated to the joint line with a knife.  We excised the scar from under the extensor mechanism medially.  I then  excised the scarring in the infrapatellar fat pad laterally and excised all of the scar tissue in the suprapatellar pouch and lateral gutter.  I then was able to flex the knee to 130 degrees easily.  The polyethylene looked fine and the components were  in excellent position and were well fixed.  The wound was then copiously irrigated with saline solution.  We then closed the arthrotomy over a Hemovac drain with a running 0 Stratafix suture.  Flexion against gravity was 130 degrees and patella tracking  normally.  There was no evidence of any significant tension on the suture line.  Tourniquet was then released, total time of 13 minutes.  Subcutaneous was closed with interrupted 2-0 Vicryl and subcuticular running 4-0 Monocryl.  Incisions were cleaned  and dried and Steri-Strips and sterile dressing applied.  She was then awakened and transported to recovery in stable condition.  VN/NUANCE  D:02/06/2019 T:02/06/2019 JOB:[id number]

## 2019-02-06 NOTE — Discharge Instructions (Signed)
Dr. Gaynelle Arabian Total Joint Specialist [facility] [address] [place], [place] [address] [phone]  POSTOPERATIVE DIRECTIONS  Knee Rehabilitation, Guidelines Following Surgery  Results after knee surgery are often greatly improved when you follow the exercise, range of motion and muscle strengthening exercises prescribed by your doctor. Safety measures are also important to protect the knee from further injury. Any time any of these exercises cause you to have increased pain or swelling in your knee joint, decrease the amount until you are comfortable again and slowly increase them. If you have problems or questions, call your caregiver or physical therapist for advice.   HOME CARE INSTRUCTIONS   Remove items at home which could result in a fall. This includes throw rugs or furniture in walking pathways.   ICE to the affected knee every three hours for 30 minutes at a time and then as needed for pain and swelling.  Continue to use ice on the knee for pain and swelling from surgery. You may notice swelling that will progress down to the foot and ankle.  This is normal after surgery.  Elevate the leg when you are not up walking on it.    Continue to use the breathing machine which will help keep your temperature down.  It is common for your temperature to cycle up and down following surgery, especially at night when you are not up moving around and exerting yourself.  The breathing machine keeps your lungs expanded and your temperature down.  Do not place pillow under knee, focus on keeping the knee straight while resting  DIET You may resume your previous home diet once your are discharged from the hospital.  DRESSING / WOUND CARE / SHOWERING You may change your dressing 3-5 days after surgery.  Then change the dressing every day with sterile gauze.  Please use good hand washing techniques before changing the dressing.  Do not use any lotions or creams on the  incision until instructed by your surgeon. You may start showering once you are discharged home but do not submerge the incision under water. Just pat the incision dry and apply a dry gauze dressing on daily. Change the surgical dressing daily and reapply a dry dressing each time.  ACTIVITY Walk with your walker as instructed. Use walker as long as suggested by your caregivers. Avoid periods of inactivity such as sitting longer than an hour when not asleep. This helps prevent blood clots.  You may resume a sexual relationship in one month or when given the OK by your doctor.  You may return to work once you are cleared by your doctor.  Do not drive a car for 6 weeks or until released by you surgeon.  Do not drive while taking narcotics.  WEIGHT BEARING Weight bearing as tolerated with assist device (walker, cane, etc) as directed, use it as long as suggested by your surgeon or therapist, typically at least 4-6 weeks.  POSTOPERATIVE CONSTIPATION PROTOCOL Constipation - defined medically as fewer than three stools per week and severe constipation as less than one stool per week.  One of the most common issues patients have following surgery is constipation.  Even if you have a regular bowel pattern at home, your normal regimen is likely to be disrupted due to multiple reasons following surgery.  Combination of anesthesia, postoperative narcotics, change in appetite and fluid intake all can affect your bowels.  In order to avoid complications following surgery, here are some recommendations in order  to help you during your recovery period.  Colace (docusate) - Pick up an over-the-counter form of Colace or another stool softener and take twice a day as long as you are requiring postoperative pain medications.  Take with a full glass of water daily.  If you experience loose stools or diarrhea, hold the colace until you stool forms back up.  If your symptoms do not get better within 1 week or if they  get worse, check with your doctor.  Dulcolax (bisacodyl) - Pick up over-the-counter and take as directed by the product packaging as needed to assist with the movement of your bowels.  Take with a full glass of water.  Use this product as needed if not relieved by Colace only.   MiraLax (polyethylene glycol) - Pick up over-the-counter to have on hand.  MiraLax is a solution that will increase the amount of water in your bowels to assist with bowel movements.  Take as directed and can mix with a glass of water, juice, soda, coffee, or tea.  Take if you go more than two days without a movement. Do not use MiraLax more than once per day. Call your doctor if you are still constipated or irregular after using this medication for 7 days in a row.  If you continue to have problems with postoperative constipation, please contact the office for further assistance and recommendations.  If you experience "the worst abdominal pain ever" or develop nausea or vomiting, please contact the office immediatly for further recommendations for treatment.  ITCHING If you experience itching with your medications, try taking only a single pain pill, or even half a pain pill at a time.  You can also use Benadryl over the counter for itching or also to help with sleep.   TED HOSE STOCKINGS Wear the elastic stockings on both legs for three weeks following surgery during the day but you may remove then at night for sleeping.  MEDICATIONS See your medication summary on the After Visit Summary that the nursing staff will review with you prior to discharge.  You may have some home medications which will be placed on hold until you complete the course of blood thinner medication.  It is important for you to complete the blood thinner medication as prescribed by your surgeon.  Continue your approved medications as instructed at time of discharge.  PRECAUTIONS If you experience chest pain or shortness of breath - call 911  immediately for transfer to the hospital emergency department.  If you develop a fever greater that 101 F, purulent drainage from wound, increased redness or drainage from wound, foul odor from the wound/dressing, or calf pain - CONTACT YOUR SURGEON.                                                   FOLLOW-UP APPOINTMENTS Make sure you keep all of your appointments after your operation with your surgeon and caregivers. You should call the office at the above phone number and make an appointment for approximately two weeks after the date of your surgery or on the date instructed by your surgeon outlined in the "After Visit Summary".  RANGE OF MOTION AND STRENGTHENING EXERCISES  Rehabilitation of the knee is important following a knee injury or an operation. After just a few days of immobilization, the muscles of the thigh which  control the knee become weakened and shrink (atrophy). Knee exercises are designed to build up the tone and strength of the thigh muscles and to improve knee motion. Often times heat used for twenty to thirty minutes before working out will loosen up your tissues and help with improving the range of motion but do not use heat for the first two weeks following surgery. These exercises can be done on a training (exercise) mat, on the floor, on a table or on a bed. Use what ever works the best and is most comfortable for you Knee exercises include:   Leg Lifts - While your knee is still immobilized in a splint or cast, you can do straight leg raises. Lift the leg to 60 degrees, hold for 3 sec, and slowly lower the leg. Repeat 10-20 times 2-3 times daily. Perform this exercise against resistance later as your knee gets better.   Quad and Hamstring Sets - Tighten up the muscle on the front of the thigh (Quad) and hold for 5-10 sec. Repeat this 10-20 times hourly. Hamstring sets are done by pushing the foot backward against an object and holding for 5-10 sec. Repeat as with quad sets.    Leg Slides: Lying on your back, slowly slide your foot toward your buttocks, bending your knee up off the floor (only go as far as is comfortable). Then slowly slide your foot back down until your leg is flat on the floor again.  Angel Wings: Lying on your back spread your legs to the side as far apart as you can without causing discomfort.  A rehabilitation program following serious knee injuries can speed recovery and prevent re-injury in the future due to weakened muscles. Contact your doctor or a physical therapist for more information on knee rehabilitation.   IF YOU ARE TRANSFERRED TO A SKILLED REHAB FACILITY If the patient is transferred to a skilled rehab facility following release from the hospital, a list of the current medications will be sent to the facility for the patient to continue.  When discharged from the skilled rehab facility, please have the facility set up the patient's [place] prior to being released. Also, the skilled facility will be responsible for providing the patient with their medications at time of release from the facility to include their pain medication, the muscle relaxants, and their blood thinner medication. If the patient is still at the rehab facility at time of the two week follow up appointment, the skilled rehab facility will also need to assist the patient in arranging follow up appointment in our office and any transportation needs.  MAKE SURE YOU:   Understand these instructions.   Get help right away if you are not doing well or get worse.    Pick up stool softner and laxative for home use following surgery while on pain medications. Do not submerge incision under water. Please use good hand washing techniques while changing dressing each day. May shower starting three days after surgery. Please use a clean towel to pat the incision dry following showers. Continue to use ice for pain and swelling after surgery. Do not use  any lotions or creams on the incision until instructed by your surgeon.

## 2019-02-06 NOTE — Anesthesia Procedure Notes (Signed)
Anesthesia Regional Block: Adductor canal block   Pre-Anesthetic Checklist: ,, timeout performed, Correct Patient, Correct Site, Correct Laterality, Correct Procedure, Correct Position, site marked, Risks and benefits discussed,  Surgical consent,  Pre-op evaluation,  At surgeon's request and post-op pain management  Laterality: Right  Prep: Maximum Sterile Barrier Precautions used, chloraprep       Needles:  Injection technique: Single-shot  Needle Type: Echogenic Stimulator Needle     Needle Length: 9cm  Needle Gauge: 22     Additional Needles:   Procedures:,,,, ultrasound used (permanent image in chart),,,,  Narrative:  Start time: 02/06/2019 1:55 PM End time: 02/06/2019 2:00 PM Injection made incrementally with aspirations every 5 mL.  Performed by: Personally  Anesthesiologist: Pervis Hocking, DO  Additional Notes: Monitors applied. No increased pain on injection. No increased resistance to injection. Injection made in 5cc increments. Good needle visualization. Patient tolerated procedure well.

## 2019-02-07 ENCOUNTER — Encounter (HOSPITAL_COMMUNITY): Payer: Self-pay | Admitting: Orthopedic Surgery

## 2019-02-07 DIAGNOSIS — M24661 Ankylosis, right knee: Secondary | ICD-10-CM | POA: Diagnosis not present

## 2019-02-07 MED ORDER — PROMETHAZINE HCL 25 MG PO TABS: 25.0000 mg | ORAL_TABLET | Freq: Four times a day (QID) | ORAL | 0 refills | Status: DC | PRN

## 2019-02-07 NOTE — Plan of Care (Signed)
resolved 

## 2019-02-07 NOTE — TOC Transition Note (Signed)
Transition of Care [facility]) - CM/SW Discharge Note   Patient Details  Name: Deanna Dorsey MRN: [id number] Date of Birth: 09-07-1955  [facility]) CM/SW Contact:  Dessa Phi, RN Phone Number: 02/07/2019, 10:55 AM   Clinical Narrative:  Patient has otpt PT already set up. Has all dme-rw,3n1. No CM needs.     Final next level of care: OP Rehab Barriers to Discharge: No Barriers Identified   Patient Goals and CMS Choice        Discharge Placement                       Discharge Plan and Services                                     Social Determinants of Health (SDOH) Interventions     Readmission Risk Interventions No flowsheet data found.

## 2019-02-07 NOTE — Progress Notes (Signed)
   Subjective: 1 Day Post-Op Procedure(s) (LRB): KNEE ARTHROTOMY; SCAR EXCISION (Right) Patient reports pain as mild.   We will start therapy today.  Plan is to go Home after hospital stay.  Objective: Vital signs in last 24 hours: Temp:  [97.6 F (36.4 C)-98.6 F (37 C)] 98 F (36.7 C) (11/26 0515) Pulse Rate:  [61-97] 61 (11/26 0515) Resp:  [13-19] 16 (11/26 0515) BP: (115-164)/(62-95) 115/62 (11/26 0515) SpO2:  [96 %-100 %] 97 % (11/26 0515) Weight:  [74 kg] 74 kg (11/25 1259)  Intake/Output from previous day:  Intake/Output Summary (Last 24 hours) at 02/07/2019 0836 Last data filed at 02/07/2019 0604 Gross per 24 hour  Intake 2198.75 ml  Output 560 ml  Net 1638.75 ml    Intake/Output this shift: No intake/output data recorded.  Labs: Recent Labs    02/04/19 1424  HGB 12.3   Recent Labs    02/04/19 1424  WBC 4.3  RBC 3.89  HCT 37.7  PLT 310   Recent Labs    02/04/19 1424  NA 140  K 4.0  CL 106  CO2 27  BUN 15  CREATININE 0.59  GLUCOSE 91  CALCIUM 9.3   Recent Labs    02/04/19 1424  INR 1.0    EXAM General - Patient is Alert, Appropriate and Oriented Extremity - Neurologically intact Neurovascular intact No cellulitis present Compartment soft Dressing - dressing C/D/I Motor Function - intact, moving foot and toes well on exam.  Hemovac pulled without difficulty.  Past Medical History:  Diagnosis Date  . Arthritis   . History of kidney stones    1982  . PONV (postoperative nausea and vomiting)   . Sleep apnea     Assessment/Plan: 1 Day Post-Op Procedure(s) (LRB): KNEE ARTHROTOMY; SCAR EXCISION (Right) Principal Problem:   Arthrofibrosis of total knee arthroplasty, initial encounter ([place]) Active Problems:   Arthrofibrosis of total knee arthroplasty ([facility])   Advance diet Up with therapy D/C IV fluids  Discharge home after PT. Outpatient PT starts tomorrow  DVT Prophylaxis - Aspirin Weight-Bearing as tolerated to right  leg   Gaynelle Arabian 02/07/2019, 8:36 AM

## 2019-02-07 NOTE — Evaluation (Signed)
Physical Therapy Evaluation Patient Details Name: Deanna Dorsey MRN: [id number] DOB: [birth year] Today's Date: 02/07/2019   History of Present Illness  s/p R knee scar excision and arthrotomy. PMH: R TKA 2019  Clinical Impression  Patient evaluated by Physical Therapy with no further acute PT needs identified. All education has been completed and the patient has no further questions.  See below for any follow-up Physical Therapy or equipment needs. PT is signing off. Thank you for this referral.     Follow Up Recommendations Follow surgeon's recommendation for DC plan and follow-up therapies;Outpatient PT    Equipment Recommendations  None recommended by PT    Recommendations for Other Services       Precautions / Restrictions Precautions Precautions: Knee Restrictions Weight Bearing Restrictions: No Other Position/Activity Restrictions: WBAT      Mobility  Bed Mobility Overal bed mobility: Independent                Transfers Overall transfer level: Modified independent Equipment used: Rolling walker (2 wheeled)                Ambulation/Gait Ambulation/Gait assistance: Supervision;Modified independent (Device/Increase time) Gait Distance (Feet): 160 Feet Assistive device: Rolling walker (2 wheeled) Gait Pattern/deviations: Step-through pattern;Decreased stance time - right     General Gait Details: cues for sequence and gait progression  Stairs Stairs: Yes   Stair Management: No rails;Step to pattern;Forwards;With cane Number of Stairs: 3 General stair comments: cues for sequence and safe technique  Wheelchair Mobility    Modified Rankin (Stroke Patients Only)       Balance                                             Pertinent Vitals/Pain Pain Assessment: 0-10 Pain Score: 3  Pain Location: right knee with exercises Pain Descriptors / Indicators: Grimacing;Sore Pain Intervention(s): Monitored during session;Limited  activity within patient's tolerance;Premedicated before session;Repositioned    Home Living Family/patient expects to be discharged to:: Private residence Living Arrangements: Spouse/significant other Available Help at Discharge: Family Type of Home: House Home Access: Stairs to enter   Technical brewer of Steps: 3 Home Layout: One level;Laundry or work area in [place]: Environmental consultant - 2 wheels;Cane - single point      Prior Function Level of Independence: Independent               Hand Dominance        Extremity/Trunk Assessment   Upper Extremity Assessment Upper Extremity Assessment: Overall WFL for tasks assessed    Lower Extremity Assessment Lower Extremity Assessment: RLE deficits/detail RLE Deficits / Details: grossly WFL; knee AROM 5 to 100 degrees right knee flexion       Communication   Communication: No difficulties  Cognition Arousal/Alertness: Awake/alert Behavior During Therapy: WFL for tasks assessed/performed Overall Cognitive Status: Within Functional Limits for tasks assessed                                        General Comments      Exercises Total Joint Exercises Quad Sets: AROM;Both;10 reps Heel Slides: AROM;AAROM;Right;10 reps Knee Flexion: AROM;Right;5 reps;Seated   Assessment/Plan    PT Assessment All further PT needs can be met in the next venue of care  PT Problem  List         PT Treatment Interventions      PT Goals (Current goals can be found in the Care Plan section)  Acute Rehab PT Goals PT Goal Formulation: All assessment and education complete, DC therapy Time For Goal Achievement: 02/07/19 Potential to Achieve Goals: Good    Frequency     Barriers to discharge        Co-evaluation               AM-PAC PT "6 Clicks" Mobility  Outcome Measure Help needed turning from your back to your side while in a flat bed without using bedrails?: None Help needed moving from lying  on your back to sitting on the side of a flat bed without using bedrails?: None Help needed moving to and from a bed to a chair (including a wheelchair)?: None Help needed standing up from a chair using your arms (e.g., wheelchair or bedside chair)?: None Help needed to walk in hospital room?: A Little Help needed climbing 3-5 steps with a railing? : A Little 6 Click Score: 22    End of Session Equipment Utilized During Treatment: Gait belt Activity Tolerance: Patient tolerated treatment well Patient left: in bed;with call bell/phone within reach;with family/visitor present   PT Visit Diagnosis: Difficulty in walking, not elsewhere classified (R26.2)    Time: 0397-9536 PT Time Calculation (min) (ACUTE ONLY): 33 min   Charges:   PT Evaluation $PT Eval Low Complexity: 1 Low PT Treatments $Gait Training: 8-22 mins        Kenyon Ana, PT  Pager: [phone] Acute Rehab Dept [facility]): [phone]   02/07/2019   [facility] 02/07/2019, 10:21 AM

## 2019-02-11 NOTE — Discharge Summary (Signed)
Physician Discharge Summary   Patient ID: Deanna Dorsey MRN: [id number] DOB/AGE: November 27, 1955 63 y.o.  Admit date: 02/06/2019 Discharge date: 02/07/2019  Primary Diagnosis: Arthrofibrosis, right knee, status post total knee arthroplasty   Admission Diagnoses:  Past Medical History:  Diagnosis Date  . Arthritis   . History of kidney stones    1982  . PONV (postoperative nausea and vomiting)   . Sleep apnea    Discharge Diagnoses:   Principal Problem:   Arthrofibrosis of total knee arthroplasty, initial encounter [facility]) Active Problems:   Arthrofibrosis of total knee arthroplasty ([place])  Estimated body mass index is 28.89 kg/m as calculated from the following:   Height as of this encounter: 5\' 3"  (1.6 m).   Weight as of this encounter: 74 kg.  Procedure:  Procedure(s) (LRB): KNEE ARTHROTOMY; SCAR EXCISION (Right)   Consults: None  HPI: The patient is a 63 year old female who underwent a right total knee arthroplasty in 2019 in [place].  She has developed significant stiffness in the knee, which has not responded to physical therapy.  I saw her in the office approximately 2 months ago.  Range of motion was noted to be flexion of 95 degrees with a firm endpoint and she was within 5 degrees of full extension.  She had extensive physical therapy and I was unable to get motion past this point.  We discussed treatment options and we decided to pursue arthrotomy and scar excision.  Laboratory Data: Hospital Outpatient Visit on 02/04/2019  Component Date Value Ref Range Status  . aPTT 02/04/2019 27  24 - 36 seconds Final   Performed at [facility], [place] [address], [place], [place] [address]  . WBC 02/04/2019 4.3  4.0 - 10.5 K/uL Final  . RBC 02/04/2019 3.89  3.87 - 5.11 MIL/uL Final  . Hemoglobin 02/04/2019 12.3  12.0 - 15.0 g/dL Final  . HCT 02/04/2019 37.7  36.0 - 46.0 % Final  . MCV 02/04/2019 96.9  80.0 - 100.0 fL Final  . MCH 02/04/2019 31.6  26.0 - 34.0 pg  Final  . MCHC 02/04/2019 32.6  30.0 - 36.0 g/dL Final  . RDW 02/04/2019 13.2  11.5 - 15.5 % Final  . Platelets 02/04/2019 310  150 - 400 K/uL Final  . nRBC 02/04/2019 0.0  0.0 - 0.2 % Final  . Neutrophils Relative % 02/04/2019 63  % Final  . Neutro Abs 02/04/2019 2.8  1.7 - 7.7 K/uL Final  . Lymphocytes Relative 02/04/2019 26  % Final  . Lymphs Abs 02/04/2019 1.1  0.7 - 4.0 K/uL Final  . Monocytes Relative 02/04/2019 7  % Final  . Monocytes Absolute 02/04/2019 0.3  0.1 - 1.0 K/uL Final  . Eosinophils Relative 02/04/2019 2  % Final  . Eosinophils Absolute 02/04/2019 0.1  0.0 - 0.5 K/uL Final  . Basophils Relative 02/04/2019 1  % Final  . Basophils Absolute 02/04/2019 0.0  0.0 - 0.1 K/uL Final  . Immature Granulocytes 02/04/2019 1  % Final  . Abs Immature Granulocytes 02/04/2019 0.02  0.00 - 0.07 K/uL Final   Performed at [facility], [place] [address] [place], [place] [address]  . Sodium 02/04/2019 140  135 - 145 mmol/L Final  . Potassium 02/04/2019 4.0  3.5 - 5.1 mmol/L Final  . Chloride 02/04/2019 106  98 - 111 mmol/L Final  . CO2 02/04/2019 27  22 - 32 mmol/L Final  . Glucose, Bld 02/04/2019 91  70 - 99 mg/dL Final  .  BUN 02/04/2019 15  8 - 23 mg/dL Final  . Creatinine, Ser 02/04/2019 0.59  0.44 - 1.00 mg/dL Final  . Calcium 02/04/2019 9.3  8.9 - 10.3 mg/dL Final  . Total Protein 02/04/2019 7.2  6.5 - 8.1 g/dL Final  . Albumin 02/04/2019 4.2  3.5 - 5.0 g/dL Final  . AST 02/04/2019 19  15 - 41 U/L Final  . ALT 02/04/2019 20  0 - 44 U/L Final  . Alkaline Phosphatase 02/04/2019 54  38 - 126 U/L Final  . Total Bilirubin 02/04/2019 0.6  0.3 - 1.2 mg/dL Final  . GFR calc non Af Amer 02/04/2019 >60  >60 mL/min Final  . GFR calc Af Amer 02/04/2019 >60  >60 mL/min Final  . Anion gap 02/04/2019 7  5 - 15 Final   Performed at [facility], [place] [address], [place], [place] [address]  . Prothrombin Time 02/04/2019 12.7  11.4 - 15.2 seconds Final  . INR  02/04/2019 1.0  0.8 - 1.2 Final   Comment: (NOTE) INR goal varies based on device and disease states. Performed at [facility], [place] [address] [place], [place] [address]   . ABO/RH(D) 02/04/2019 A POS   Final  . Antibody Screen 02/04/2019 NEG   Final  . Sample Expiration 02/04/2019 02/09/2019,2359   Final  . Extend sample reason 02/04/2019    Final                   Value:NO TRANSFUSIONS OR PREGNANCY IN THE PAST 3 MONTHS Performed at [place] [address] [place], [place] [address]   . MRSA, PCR 02/04/2019 NEGATIVE  NEGATIVE Final  . Staphylococcus aureus 02/04/2019 POSITIVE* NEGATIVE Final   Comment: (NOTE) The Xpert SA Assay (FDA approved for NASAL specimens in patients 19 years of age and older), is one component of a comprehensive surveillance program. It is not intended to diagnose infection nor to guide or monitor treatment. Performed at [facility], [place] [address], [place], [place] [address]   . ABO/RH(D) 02/04/2019    Final                   Value:A POS Performed at [facility], [place] [address] [place], [place] [address]   Hospital Outpatient Visit on 02/02/2019  Component Date Value Ref Range Status  . SARS-CoV-2, NAA 02/02/2019 NOT DETECTED  NOT DETECTED Final   Comment: (NOTE) This nucleic acid amplification test was developed and its performance characteristics determined by [employer]. Nucleic acid amplification tests include PCR and TMA. This test has not been FDA cleared or approved. This test has been authorized by FDA under an Emergency Use Authorization (EUA). This test is only authorized for the duration of time the declaration that circumstances exist justifying the authorization of the emergency use of in vitro diagnostic tests for detection of SARS-CoV-2 virus and/or diagnosis of COVID-19 infection under section 564(b)(1) of the Act, 21 U.S.C. [id number])  (1), unless the authorization is terminated or revoked sooner. When diagnostic testing is negative, the possibility of a false negative result should be considered in the context of a patient's recent exposures and the presence of clinical signs and symptoms consistent with COVID-19. An individual without symptoms of COVID- 19 and who is not shedding SARS-CoV-2 vi                          rus would expect  to have a negative (not detected) result in this assay. Performed At: [facility] [address] [place], [place] [id number] Katina Degree MDPhD [id number]   . Coronavirus Source 02/02/2019 NASOPHARYNGEAL   Final   Performed at [facility], [place] [address] [place], [place] [address]     X-Rays:No results found.  EKG: Orders placed or performed during the hospital encounter of 02/04/19  . EKG  . EKG     Hospital Course: Cyrenity Ayler is a 63 y.o. who was admitted to [facility]. They were brought to the operating room on 02/06/2019 and underwent Procedure(s): KNEE ARTHROTOMY; [place].  Patient tolerated the procedure well and was later transferred to the recovery room and then to the orthopaedic floor for postoperative care. They were given PO and IV analgesics for pain control following their surgery. They were given 24 hours of postoperative antibiotics of  Anti-infectives (From admission, onward)   Start     Dose/Rate Route Frequency Ordered Stop   02/06/19 2200  ceFAZolin (ANCEF) IVPB 1 g/50 mL premix     1 g 100 mL/hr over 30 Minutes Intravenous  Once 02/06/19 1744 02/06/19 2228   02/06/19 1500  ceFAZolin (ANCEF) IVPB 2g/100 mL premix     2 g 200 mL/hr over 30 Minutes Intravenous On call to O.R. 02/06/19 1257 02/06/19 1543     and started on DVT prophylaxis in the form of Aspirin.   PT and OT were ordered for total joint protocol. Discharge planning consulted to help with postop disposition and equipment needs.  Patient had a good night on the evening  of surgery. They started to get up OOB with therapy on POD #1. Pt was seen during rounds and was ready to go home pending progress with therapy. Hemovac drain was pulled without difficulty. She worked with therapy on POD #1 and was meeting her goals. Pt was discharged to home later that day in stable condition.  Diet: Regular diet Activity: WBAT Follow-up: in 2 weeks Disposition: Home with outpatient PT Discharged Condition: stable   Discharge Instructions    Call MD / Call 911   Complete by: As directed    If you experience chest pain or shortness of breath, CALL 911 and be transported to the hospital emergency room.  If you develope a fever above 101 F, pus (white drainage) or increased drainage or redness at the wound, or calf pain, call your surgeon's office.   Change dressing   Complete by: As directed    Change dressing on Friday, then change the dressing daily with sterile 4 x 4 inch gauze dressing and apply TED hose.   Constipation Prevention   Complete by: As directed    Drink plenty of fluids.  Prune juice may be helpful.  You may use a stool softener, such as Colace (over the counter) 100 mg twice a day.  Use MiraLax (over the counter) for constipation as needed.   Diet - low sodium heart healthy   Complete by: As directed    Do not put a pillow under the knee. Place it under the heel.   Complete by: As directed    Driving restrictions   Complete by: As directed    No driving for two weeks   TED hose   Complete by: As directed    Use stockings (TED hose) for three weeks on both leg(s).  You may remove them at night for sleeping.   Weight bearing as  tolerated   Complete by: As directed      Allergies as of 02/07/2019      Reactions   Doxycycline Anaphylaxis, Other (See Comments)   Pt started swelling and breaking out with rash per dr       Medication List    TAKE these medications   aspirin 325 MG EC tablet Take 1 tablet (325 mg total) by mouth daily for 20 days.  Then take one 81 mg aspirin once a day for three weeks. Then discontinue aspirin.   atorvastatin 10 MG tablet Commonly known as: LIPITOR Take 10 mg by mouth at bedtime.   HYDROcodone-acetaminophen 5-325 MG tablet Commonly known as: NORCO/VICODIN Take 1-2 tablets by mouth every 6 (six) hours as needed for severe pain.   methocarbamol 500 MG tablet Commonly known as: ROBAXIN Take 1 tablet (500 mg total) by mouth every 6 (six) hours as needed for muscle spasms.   promethazine 25 MG tablet Commonly known as: PHENERGAN Take 1 tablet (25 mg total) by mouth every 6 (six) hours as needed for nausea or vomiting.   traMADol 50 MG tablet Commonly known as: ULTRAM Take 1-2 tablets (50-100 mg total) by mouth every 6 (six) hours as needed for moderate pain.            Discharge Care Instructions  (From admission, onward)         Start     Ordered   02/06/19 0000  Weight bearing as tolerated     02/06/19 2130   02/06/19 0000  Change dressing    Comments: Change dressing on Friday, then change the dressing daily with sterile 4 x 4 inch gauze dressing and apply TED hose.   02/06/19 2130         Follow-up Information    Gaynelle Arabian, MD. Schedule an appointment as soon as possible for a visit on 02/21/2019.   Specialty: Orthopedic Surgery Contact information: [address] [place] [address] [phone]           Signed: Theresa Duty, PA-C Orthopedic Surgery 02/11/2019, 9:10 AM

## 2020-03-20 ENCOUNTER — Other Ambulatory Visit: Payer: Self-pay | Admitting: Student

## 2020-03-20 ENCOUNTER — Other Ambulatory Visit (HOSPITAL_COMMUNITY): Payer: Self-pay | Admitting: Student

## 2020-03-20 DIAGNOSIS — M25561 Pain in right knee: Secondary | ICD-10-CM

## 2020-03-31 ENCOUNTER — Encounter (HOSPITAL_COMMUNITY): Payer: Medicare Other

## 2020-03-31 ENCOUNTER — Encounter (HOSPITAL_COMMUNITY): Payer: Self-pay

## 2020-04-07 ENCOUNTER — Encounter (HOSPITAL_COMMUNITY)
Admission: RE | Admit: 2020-04-07 | Discharge: 2020-04-07 | Disposition: A | Discharge status: Home / Self Care | Payer: Medicare Other | Source: Ambulatory Visit | Attending: Student | Admitting: Student

## 2020-04-07 ENCOUNTER — Other Ambulatory Visit: Payer: Self-pay

## 2020-04-07 DIAGNOSIS — M25561 Pain in right knee: Secondary | ICD-10-CM | POA: Insufficient documentation

## 2020-04-07 MED ORDER — TECHNETIUM TC 99M MEDRONATE IV KIT
20.0000 | PACK | Freq: Once | INTRAVENOUS | Status: AC | PRN
  Administered 2020-04-07: 20 via INTRAVENOUS

## 2020-06-10 ENCOUNTER — Other Ambulatory Visit: Payer: Self-pay | Admitting: Family Medicine

## 2020-06-10 DIAGNOSIS — R928 Other abnormal and inconclusive findings on diagnostic imaging of breast: Secondary | ICD-10-CM

## 2020-06-11 ENCOUNTER — Other Ambulatory Visit: Payer: Self-pay

## 2020-06-11 ENCOUNTER — Ambulatory Visit
Admission: RE | Admit: 2020-06-11 | Discharge: 2020-06-11 | Disposition: A | Discharge status: Home / Self Care | Payer: Medicare Other | Source: Ambulatory Visit | Attending: Family Medicine | Admitting: Family Medicine

## 2020-06-11 DIAGNOSIS — R928 Other abnormal and inconclusive findings on diagnostic imaging of breast: Secondary | ICD-10-CM

## 2020-06-30 ENCOUNTER — Encounter: Payer: Self-pay | Admitting: Pulmonary Disease

## 2020-07-17 ENCOUNTER — Ambulatory Visit (INDEPENDENT_AMBULATORY_CARE_PROVIDER_SITE_OTHER): Payer: Medicare Other | Admitting: Pulmonary Disease

## 2020-07-17 ENCOUNTER — Encounter: Payer: Self-pay | Admitting: Pulmonary Disease

## 2020-07-17 ENCOUNTER — Other Ambulatory Visit: Payer: Self-pay

## 2020-07-17 VITALS — BP 116/72 | HR 78 | Temp 98.0°F | Ht 63.0 in | Wt 162.6 lb

## 2020-07-17 DIAGNOSIS — J4540 Moderate persistent asthma, uncomplicated: Secondary | ICD-10-CM | POA: Diagnosis not present

## 2020-07-17 DIAGNOSIS — R0600 Dyspnea, unspecified: Secondary | ICD-10-CM

## 2020-07-17 DIAGNOSIS — K219 Gastro-esophageal reflux disease without esophagitis: Secondary | ICD-10-CM | POA: Diagnosis not present

## 2020-07-17 DIAGNOSIS — J45909 Unspecified asthma, uncomplicated: Secondary | ICD-10-CM

## 2020-07-17 DIAGNOSIS — R059 Cough, unspecified: Secondary | ICD-10-CM | POA: Diagnosis not present

## 2020-07-17 DIAGNOSIS — R0609 Other forms of dyspnea: Secondary | ICD-10-CM

## 2020-07-17 HISTORY — DX: Unspecified asthma, uncomplicated: J45.909

## 2020-07-17 MED ORDER — TRELEGY ELLIPTA 200-62.5-25 MCG/INH IN AEPB: 1.0000 | INHALATION_SPRAY | Freq: Every day | RESPIRATORY_TRACT | 11 refills | Status: DC

## 2020-07-17 NOTE — Progress Notes (Signed)
@Patient  ID: Deanna Dorsey, female    DOB: February 04, 1956, 65 y.o.   MRN: [id number]  Chief Complaint  Patient presents with  . Consult    Was diagnosed with covid back in Sept 2021. Has noticed an increase in SOB and coughing since then.     Referring provider: Lanelle Bal, PA-C  HPI:   65 year old whom we are seeing in consultation for dyspnea on exertion and cough.  PCP note reviewed.  Patient grew up and lived in [place] for majority of life.  Moved to [place] with husband.  Had significant allergy symptoms there.  Seasonally had a lot of stuffy nose, runny nose occasionally cough.  Moved back to [place] somewhat recently in 2021.  Husband was in the process of renovating the house.  Describes clear trigger of embedded old cigarette smoke causing worsening cough and shortness of breath.  Husband is in the process of restoring the house, plays a lot of sheet rock and repainted.  Still a room or 2 that was left unfinished and continue to trigger her.  Unfortunately both contracted COVID-19 pneumonia in 11/2019.  She received Regeneron and had relatively quick improvement in symptoms.  Unfortunately, her husband continue to worsen and eventually was talking taken to the hospital.  He was intubated at [facility].  Transferred to [facility] and suddenly passed away a few days later.  Over the last several months she has had worsening dyspnea on exertion.  It comes and goes in terms of severity.  May be slightly better prior to March 2022.  Since March, things have been worse.  This was a couple days after her first COVID-19 vaccination.  On further questioning this was also around the time the pollen started to come out and become quite bad.  She describes dyspnea on exertion on flat surfaces but worse with inclines or stairs.  No timing during day were things are better or worse.  Again may be a bit worse with pollen.  No other environmental changes other than what is described  above.  Has been using [place] with some mild improvement in symptoms.  Finds albuterol to help symptomatically when she uses it as needed.  No positional changes with things are better or worse.  No other alleviating or exacerbating factors.  Chest x-ray 02/2020 reviewed interpreted as clear lungs bilaterally with trachea deviated to the right but appears rotated with head of clavicle shifted to the right.  Chest x-ray 05/2020 reviewed and interpreted as clear lungs bilaterally with similar tracheal deviation and rotation of clavicles.  PMH: Hypertension, arrhythmia, hyperlipidemia, seasonal allergies, sleep apnea Surgical history: Back surgery, sinus surgery, hysterectomy, tubal ligation Family history: Father with CAD, father with history of DVT, sister with asthma Social history: Never smoker, lives in [place], unfortunately husband passed away from [place] in September 2021 at [facility] / Pulmonary Flowsheets:   ACT:  No flowsheet data found.  MMRC: mMRC Dyspnea Scale mMRC Score  07/17/2020 1    Epworth:  No flowsheet data found.  Tests:   FENO:  No results found for: NITRICOXIDE  PFT: No flowsheet data found.  WALK:  No flowsheet data found.  Imaging: Personally reviewed and as per EMR  Lab Results: Personally reviewed, no history of significant elevated eosinophils, no anemia CBC    Component Value Date/Time   WBC 4.3 02/04/2019 1424   RBC 3.89 02/04/2019 1424   HGB 12.3 02/04/2019 1424   HCT 37.7 02/04/2019 1424  PLT 310 02/04/2019 1424   MCV 96.9 02/04/2019 1424   MCH 31.6 02/04/2019 1424   MCHC 32.6 02/04/2019 1424   RDW 13.2 02/04/2019 1424   LYMPHSABS 1.1 02/04/2019 1424   MONOABS 0.3 02/04/2019 1424   EOSABS 0.1 02/04/2019 1424   BASOSABS 0.0 02/04/2019 1424    BMET    Component Value Date/Time   NA 140 02/04/2019 1424   K 4.0 02/04/2019 1424   CL 106 02/04/2019 1424   CO2 27 02/04/2019 1424   GLUCOSE 91 02/04/2019 1424    BUN 15 02/04/2019 1424   CREATININE 0.59 02/04/2019 1424   CALCIUM 9.3 02/04/2019 1424   GFRNONAA >60 02/04/2019 1424   GFRAA >60 02/04/2019 1424    BNP No results found for: BNP  ProBNP No results found for: PROBNP  Specialty Problems   None     Allergies  Allergen Reactions  . Doxycycline Anaphylaxis and Other (See Comments)    Pt started swelling and breaking out with rash per dr      There is no immunization history on file for this patient.  Past Medical History:  Diagnosis Date  . Arthritis   . History of kidney stones    1982  . PONV (postoperative nausea and vomiting)   . Sleep apnea     Tobacco History: Social History   Tobacco Use  Smoking Status Never Smoker  Smokeless Tobacco Never Used   Counseling given: Not Answered   Continue to not smoke  Outpatient Encounter Medications as of 07/17/2020  Medication Sig  . atorvastatin (LIPITOR) 10 MG tablet Take 10 mg by mouth daily.  Marland Kitchen esomeprazole (NEXIUM) 40 MG capsule Take 40 mg by mouth daily.  . Fluticasone-Umeclidin-Vilant (TRELEGY ELLIPTA) 200-62.5-25 MCG/INH AEPB Inhale 1 puff into the lungs daily.  . [DISCONTINUED] fluticasone-salmeterol (ADVAIR) 250-50 MCG/ACT AEPB Inhale 1 puff into the lungs 2 (two) times daily.  . [DISCONTINUED] atorvastatin (LIPITOR) 10 MG tablet Take 10 mg by mouth at bedtime.   . [DISCONTINUED] HYDROcodone-acetaminophen (NORCO/VICODIN) 5-325 MG tablet Take 1-2 tablets by mouth every 6 (six) hours as needed for severe pain.  . [DISCONTINUED] methocarbamol (ROBAXIN) 500 MG tablet Take 1 tablet (500 mg total) by mouth every 6 (six) hours as needed for muscle spasms.  . [DISCONTINUED] promethazine (PHENERGAN) 25 MG tablet Take 1 tablet (25 mg total) by mouth every 6 (six) hours as needed for nausea or vomiting.  . [DISCONTINUED] traMADol (ULTRAM) 50 MG tablet Take 1-2 tablets (50-100 mg total) by mouth every 6 (six) hours as needed for moderate pain.   No facility-administered  encounter medications on file as of 07/17/2020.     Review of Systems  Review of Systems  No chest pain with exertion.  No orthopnea or PND.  Comprehensive review of systems otherwise negative. Physical Exam  BP 116/72   Pulse 78   Temp 98 F (36.7 C) (Temporal)   Ht 5\' 3"  (1.6 m)   Wt 162 lb 9.6 oz (73.8 kg)   SpO2 98% Comment: on RA  BMI 28.80 kg/m   Wt Readings from Last 5 Encounters:  07/17/20 162 lb 9.6 oz (73.8 kg)  02/06/19 163 lb 1 oz (74 kg)  02/04/19 163 lb 1 oz (74 kg)    BMI Readings from Last 5 Encounters:  07/17/20 28.80 kg/m  02/06/19 28.89 kg/m  02/04/19 28.89 kg/m     Physical Exam General: Well-appearing, no acute distress Eyes: EOMI, no icterus Neck: Supple, no JVP Cardiovascular: Regular rate and  rhythm, no murmur Pulmonary: Clear to auscultation bilaterally, no wheeze Abdomen: Nondistended, bowel sounds present MSK: No synovitis, no joint effusion Neuro: Normal gait, no weakness Psych: Normal mood, full affect  Assessment & Plan:   Dyspnea on exertion: Suspect is multifactorial, some element of deconditioning in the setting of COVID infection, decreased physical exertion over the last several months since COVID infection.  High suspicion for poorly controlled asthma given previous atopic symptoms, clear triggers environmentally, and worsening after COVID.  Addressed as below.  Will obtain PFTs mid-to-late week for further evaluation.  Asthma: Longstanding history of allergies, been allergy tested in the past with multiple positive allergens, clear triggers with different environments both at home and elsewhere, worsened after COVID, viral illness.  Not much improvement on the mid dose Wixela.  Stop this.  Start high-dose Trelegy.  PFTs as above.  Consider phenotyping in the future to assess role of Biologics.  Cough: Likely multifactorial, component of asthma.  However given what sounds like description of dysphagia and some aspiration symptoms  with eating or drinking, high concern for esophageal pathology.  Likely reflux as well.  Is on PPI without improvement although certainly could have silent reflux irritating posterior oropharynx leading to ongoing cough.  Description of hoarseness also fits this.  Consider GI evaluation in the future.   Return in about 6 weeks (around 08/28/2020).   Lanier Clam, MD 07/17/2020

## 2020-07-17 NOTE — Patient Instructions (Addendum)
Nice to meet you  I worry some your breathing issues are related to poorly controlled asthma given your allergy symptoms and the symptoms that started before your COVID infection.  Do think that COVID is likely make the symptoms worse.  Stop the Wixela.  Use Trelegy 1 puff daily.  This is a stronger on the inhalers we could use.  If the inhaler is too expensive, please call your insurance company and ask them what the preferred alternative is.  Let us know and we can prescribe this if needed.  We will get breathing test or PFTs any return at your next visit and discuss the results and next steps to help with the breathing.  Return to clinic in 6 weeks with PFTs same day, to follow-up with Dr. Silas Flood

## 2020-07-21 ENCOUNTER — Institutional Professional Consult (permissible substitution): Payer: Medicare Other | Admitting: Pulmonary Disease

## 2020-07-23 ENCOUNTER — Other Ambulatory Visit: Payer: Self-pay | Admitting: General Surgery

## 2020-07-23 DIAGNOSIS — N6489 Other specified disorders of breast: Secondary | ICD-10-CM

## 2020-07-24 ENCOUNTER — Other Ambulatory Visit: Payer: Self-pay | Admitting: General Surgery

## 2020-07-24 DIAGNOSIS — N6489 Other specified disorders of breast: Secondary | ICD-10-CM

## 2020-08-11 ENCOUNTER — Other Ambulatory Visit: Payer: Self-pay

## 2020-08-11 DIAGNOSIS — R059 Cough, unspecified: Secondary | ICD-10-CM

## 2020-08-11 DIAGNOSIS — R0609 Other forms of dyspnea: Secondary | ICD-10-CM

## 2020-08-11 DIAGNOSIS — R0600 Dyspnea, unspecified: Secondary | ICD-10-CM

## 2020-08-12 ENCOUNTER — Other Ambulatory Visit: Payer: Self-pay

## 2020-08-12 ENCOUNTER — Encounter: Payer: Self-pay | Admitting: Pulmonary Disease

## 2020-08-12 ENCOUNTER — Ambulatory Visit (INDEPENDENT_AMBULATORY_CARE_PROVIDER_SITE_OTHER): Payer: Medicare Other | Admitting: Pulmonary Disease

## 2020-08-12 VITALS — BP 120/68 | HR 72 | Temp 98.3°F | Ht 63.0 in | Wt 157.0 lb

## 2020-08-12 DIAGNOSIS — R059 Cough, unspecified: Secondary | ICD-10-CM

## 2020-08-12 DIAGNOSIS — J302 Other seasonal allergic rhinitis: Secondary | ICD-10-CM

## 2020-08-12 DIAGNOSIS — R0600 Dyspnea, unspecified: Secondary | ICD-10-CM

## 2020-08-12 DIAGNOSIS — R0609 Other forms of dyspnea: Secondary | ICD-10-CM

## 2020-08-12 LAB — PULMONARY FUNCTION TEST
DL/VA % pred: 110 %
DL/VA: 5.14 ml/min/mmHg/L
DLCO cor % pred: 105 %
DLCO cor: 24.12 ml/min/mmHg
DLCO unc % pred: 105 %
DLCO unc: 24.12 ml/min/mmHg
FEF 25-75 Post: 3.82 L/sec
FEF 25-75 Pre: 4.11 L/sec
FEF2575-%Change-Post: -6 %
FEF2575-%Pred-Post: 197 %
FEF2575-%Pred-Pre: 211 %
FEV1-%Change-Post: -1 %
FEV1-%Pred-Post: 119 %
FEV1-%Pred-Pre: 120 %
FEV1-Post: 2.64 L
FEV1-Pre: 2.67 L
FEV1FVC-%Change-Post: 0 %
FEV1FVC-%Pred-Pre: 112 %
FEV6-%Change-Post: 0 %
FEV6-%Pred-Post: 104 %
FEV6-%Pred-Pre: 104 %
FEV6-Post: 2.98 L
FEV6-Pre: 2.99 L
FEV6FVC-%Pred-Post: 99 %
FEV6FVC-%Pred-Pre: 99 %
FVC-%Change-Post: -1 %
FVC-%Pred-Post: 104 %
FVC-%Pred-Pre: 106 %
FVC-Post: 2.98 L
FVC-Pre: 3.03 L
Post FEV1/FVC ratio: 89 %
Post FEV6/FVC ratio: 100 %
Pre FEV1/FVC ratio: 88 %
Pre FEV6/FVC Ratio: 100 %
RV % pred: 87 %
RV: 1.83 L
TLC % pred: 105 %
TLC: 5.16 L

## 2020-08-12 MED ORDER — MONTELUKAST SODIUM 10 MG PO TABS: 10.0000 mg | ORAL_TABLET | Freq: Every day | ORAL | 11 refills | Status: DC

## 2020-08-12 MED ORDER — FLUTICASONE PROPIONATE 50 MCG/ACT NA SUSP
1.0000 | Freq: Every day | NASAL | 2 refills | Status: DC
Start: 2020-08-12 — End: 2021-09-28

## 2020-08-12 NOTE — Patient Instructions (Signed)
Nice to see you again  Use the nasal rinse twice a day  Use flonase 1 spray each nare once a day - after the nasal rinse  Use montelukast 1 pill at night  Hopefully this helps with the congestion and nagging cough.  Return to clinic in 3 months or sooner as needed with Dr. Silas Flood

## 2020-08-12 NOTE — Progress Notes (Signed)
Full PFT performed today. °

## 2020-08-12 NOTE — Progress Notes (Signed)
@Patient  ID: Deanna Dorsey, female    DOB: 26-Mar-1955, 65 y.o.   MRN: [id number]  Chief Complaint  Patient presents with  . Follow-up    F/U after PFT. States her breathing has been stable since last visit. Has noticed she is losing her voice more.     Referring provider: No ref. provider found  HPI:   65 year old whom we are seeing in consultation for dyspnea on exertion and cough.   Patient is doing okay.  Improvement in cough, not totally gone but marked improvement.  With mild dyspnea on exertion.  Fatigue.  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  She account some of the improvement due to change in environment.  In addition she thinks Trelegy has helped some, particularly with the cough.  She does describe persistent congestion in the back of her throat.  Thick.  Can mobilize it after coughing throughout the day.  Endorses sensation of postnasal drip.  Is using her nasal rinse once a day.  HPI initial visit: Patient grew up and lived in [place] for majority of life.  Moved to [place] with husband.  Had significant allergy symptoms there.  Seasonally had a lot of stuffy nose, runny nose occasionally cough.  Moved back to [place] somewhat recently in 2021.  Husband was in the process of renovating the house.  Describes clear trigger of embedded old cigarette smoke causing worsening cough and shortness of breath.  Husband is in the process of restoring the house, plays a lot of sheet rock and repainted.  Still a room or 2 that was left unfinished and continue to trigger her.  Unfortunately both contracted COVID-19 pneumonia in 11/2019.  She received Regeneron and had relatively quick improvement in symptoms.  Unfortunately, her husband continue to worsen and eventually was talking taken to the hospital.  He was intubated at [facility].  Transferred to [facility] and suddenly passed away a few days later.  Over the last several months she has had worsening  dyspnea on exertion.  It comes and goes in terms of severity.  May be slightly better prior to March 2022.  Since March, things have been worse.  This was a couple days after her first COVID-19 vaccination.  On further questioning this was also around the time the pollen started to come out and become quite bad.  She describes dyspnea on exertion on flat surfaces but worse with inclines or stairs.  No timing during day were things are better or worse.  Again may be a bit worse with pollen.  No other environmental changes other than what is described above.  Has been using [place] with some mild improvement in symptoms.  Finds albuterol to help symptomatically when she uses it as needed.  No positional changes with things are better or worse.  No other alleviating or exacerbating factors.  Chest x-ray 02/2020 reviewed interpreted as clear lungs bilaterally with trachea deviated to the right but appears rotated with head of clavicle shifted to the right.  Chest x-ray 05/2020 reviewed and interpreted as clear lungs bilaterally with similar tracheal deviation and rotation of clavicles.  PMH: Hypertension, arrhythmia, hyperlipidemia, seasonal allergies, sleep apnea Surgical history: Back surgery, sinus surgery, hysterectomy, tubal ligation Family history: Father with CAD, father with history of DVT, sister with asthma Social history: Never smoker, lives in [place], unfortunately husband passed away from [place] in September 2021 at [facility] / Pulmonary Flowsheets:   ACT:  No flowsheet data found.  MMRC: mMRC Dyspnea Scale mMRC Score  07/17/2020 1    Epworth:  No flowsheet data found.  Tests:   FENO:  No results found for: NITRICOXIDE  PFT: PFT Results Latest Ref Rng & Units 08/12/2020  FVC-Pre L 3.03  FVC-Predicted Pre % 106  FVC-Post L 2.98  FVC-Predicted Post % 104  Pre FEV1/FVC % % 88  Post FEV1/FCV % % 89  FEV1-Pre L 2.67  FEV1-Predicted Pre % 120  FEV1-Post L  2.64  DLCO uncorrected ml/min/mmHg 24.12  DLCO UNC% % 105  DLCO corrected ml/min/mmHg 24.12  DLCO COR %Predicted % 105  DLVA Predicted % 110  TLC L 5.16  TLC % Predicted % 105  RV % Predicted % 87  Personally reviewed and interpreted as normal spirometry without bronchodilator response, lung volumes within normal limits, DLCO within normal limits  WALK:  No flowsheet data found.  Imaging: Personally reviewed and as per EMR  Lab Results: Personally reviewed, no history of significant elevated eosinophils, no anemia CBC    Component Value Date/Time   WBC 4.3 02/04/2019 1424   RBC 3.89 02/04/2019 1424   HGB 12.3 02/04/2019 1424   HCT 37.7 02/04/2019 1424   PLT 310 02/04/2019 1424   MCV 96.9 02/04/2019 1424   MCH 31.6 02/04/2019 1424   MCHC 32.6 02/04/2019 1424   RDW 13.2 02/04/2019 1424   LYMPHSABS 1.1 02/04/2019 1424   MONOABS 0.3 02/04/2019 1424   EOSABS 0.1 02/04/2019 1424   BASOSABS 0.0 02/04/2019 1424    BMET    Component Value Date/Time   NA 140 02/04/2019 1424   K 4.0 02/04/2019 1424   CL 106 02/04/2019 1424   CO2 27 02/04/2019 1424   GLUCOSE 91 02/04/2019 1424   BUN 15 02/04/2019 1424   CREATININE 0.59 02/04/2019 1424   CALCIUM 9.3 02/04/2019 1424   GFRNONAA >60 02/04/2019 1424   GFRAA >60 02/04/2019 1424    BNP No results found for: BNP  ProBNP No results found for: PROBNP  Specialty Problems   None     Allergies  Allergen Reactions  . Doxycycline Anaphylaxis and Other (See Comments)    Pt started swelling and breaking out with rash per dr      There is no immunization history on file for this patient.  Past Medical History:  Diagnosis Date  . Arthritis   . History of kidney stones    1982  . PONV (postoperative nausea and vomiting)   . Sleep apnea     Tobacco History: Social History   Tobacco Use  Smoking Status Never Smoker  Smokeless Tobacco Never Used   Counseling given: Not Answered   Continue to not  smoke  Outpatient Encounter Medications as of 08/12/2020  Medication Sig  . atorvastatin (LIPITOR) 10 MG tablet Take 10 mg by mouth daily.  Marland Kitchen esomeprazole (NEXIUM) 40 MG capsule Take 40 mg by mouth daily.  . fluticasone (FLONASE) 50 MCG/ACT nasal spray Place 1 spray into both nostrils daily.  . Fluticasone-Umeclidin-Vilant (TRELEGY ELLIPTA) 200-62.5-25 MCG/INH AEPB Inhale 1 puff into the lungs daily.  . montelukast (SINGULAIR) 10 MG tablet Take 1 tablet (10 mg total) by mouth at bedtime.   No facility-administered encounter medications on file as of 08/12/2020.     Review of Systems  Review of Systems  n/a Physical Exam  BP 120/68   Pulse 72   Temp 98.3 F (36.8 C) (Temporal)   Ht 5\' 3"  (1.6 m)  Wt 157 lb (71.2 kg)   SpO2 98% Comment: on RA  BMI 27.81 kg/m   Wt Readings from Last 5 Encounters:  08/12/20 157 lb (71.2 kg)  07/17/20 162 lb 9.6 oz (73.8 kg)  02/06/19 163 lb 1 oz (74 kg)  02/04/19 163 lb 1 oz (74 kg)    BMI Readings from Last 5 Encounters:  08/12/20 27.81 kg/m  07/17/20 28.80 kg/m  02/06/19 28.89 kg/m  02/04/19 28.89 kg/m     Physical Exam General: Well-appearing, no acute distress Eyes: EOMI, no icterus Neck: Supple, no JVP Cardiovascular: Regular rate and rhythm, no murmur Pulmonary: Clear to auscultation bilaterally, no wheeze Abdomen: Nondistended, bowel sounds present MSK: No synovitis, no joint effusion Neuro: Normal gait, no weakness Psych: Normal mood, full affect  Assessment & Plan:   Dyspnea on exertion: Suspect is multifactorial, some element of deconditioning in the setting of COVID infection, decreased physical exertion over the last several months since COVID infection.  Mild improvement with ICS/LABA/LAMA. PFTs WNL. Consider TTE if not imrpoving or worsens.  Asthma: Longstanding history of allergies, been allergy tested in the past with multiple positive allergens, clear triggers with different environments both at home and  elsewhere, worsened after COVID, viral illness.  Not much improvement on the mid dose Wixela. Mild improvement with high dose Trelegy. .  Consider phenotyping in the future to assess role of Biologics.  Cough: Likely multifactorial, component of asthma. Improved with Trelegy. Add montelukast and flonase and encouraged BID nasal rinses. GI eval if not improving as sounds like near globus sensation.    Return in about 3 months (around 11/12/2020).   Lanier Clam, MD 08/12/2020  I spent 35 minutes in care of the patient including review of EMR, face-to-face care, coordination of care.

## 2020-08-12 NOTE — Patient Instructions (Signed)
Full PFT performed today. °

## 2020-08-14 ENCOUNTER — Other Ambulatory Visit: Payer: Self-pay

## 2020-08-14 ENCOUNTER — Encounter (HOSPITAL_BASED_OUTPATIENT_CLINIC_OR_DEPARTMENT_OTHER): Payer: Self-pay | Admitting: General Surgery

## 2020-08-20 MED ORDER — ENSURE PRE-SURGERY PO LIQD: 296.0000 mL | Freq: Once | ORAL | Status: DC

## 2020-08-20 NOTE — Progress Notes (Signed)

## 2020-08-21 ENCOUNTER — Other Ambulatory Visit (HOSPITAL_COMMUNITY): Payer: Medicare Other

## 2020-08-24 ENCOUNTER — Other Ambulatory Visit: Payer: Self-pay

## 2020-08-24 ENCOUNTER — Ambulatory Visit
Admission: RE | Admit: 2020-08-24 | Discharge: 2020-08-24 | Disposition: A | Discharge status: Home / Self Care | Payer: Medicare Other | Source: Ambulatory Visit | Attending: General Surgery | Admitting: General Surgery

## 2020-08-24 DIAGNOSIS — N6489 Other specified disorders of breast: Secondary | ICD-10-CM

## 2020-08-25 ENCOUNTER — Ambulatory Visit (HOSPITAL_BASED_OUTPATIENT_CLINIC_OR_DEPARTMENT_OTHER): Payer: Medicare Other | Admitting: Anesthesiology

## 2020-08-25 ENCOUNTER — Other Ambulatory Visit: Payer: Self-pay

## 2020-08-25 ENCOUNTER — Ambulatory Visit
Admission: RE | Admit: 2020-08-25 | Discharge: 2020-08-25 | Disposition: A | Discharge status: Home / Self Care | Payer: Medicare Other | Source: Ambulatory Visit | Attending: General Surgery | Admitting: General Surgery

## 2020-08-25 ENCOUNTER — Encounter (HOSPITAL_BASED_OUTPATIENT_CLINIC_OR_DEPARTMENT_OTHER)
Admission: RE | Disposition: A | Discharge status: Home / Self Care | Payer: Self-pay | Source: Home / Self Care | Attending: General Surgery

## 2020-08-25 ENCOUNTER — Encounter (HOSPITAL_BASED_OUTPATIENT_CLINIC_OR_DEPARTMENT_OTHER): Payer: Self-pay | Admitting: General Surgery

## 2020-08-25 ENCOUNTER — Ambulatory Visit (HOSPITAL_BASED_OUTPATIENT_CLINIC_OR_DEPARTMENT_OTHER)
Admission: RE | Admit: 2020-08-25 | Discharge: 2020-08-25 | Disposition: A | Discharge status: Home / Self Care | Payer: Medicare Other | Attending: General Surgery | Admitting: General Surgery

## 2020-08-25 DIAGNOSIS — Z79899 Other long term (current) drug therapy: Secondary | ICD-10-CM | POA: Diagnosis not present

## 2020-08-25 DIAGNOSIS — Z7951 Long term (current) use of inhaled steroids: Secondary | ICD-10-CM | POA: Insufficient documentation

## 2020-08-25 DIAGNOSIS — Z881 Allergy status to other antibiotic agents status: Secondary | ICD-10-CM | POA: Diagnosis not present

## 2020-08-25 DIAGNOSIS — Z8041 Family history of malignant neoplasm of ovary: Secondary | ICD-10-CM | POA: Diagnosis not present

## 2020-08-25 DIAGNOSIS — N6489 Other specified disorders of breast: Secondary | ICD-10-CM

## 2020-08-25 DIAGNOSIS — Z803 Family history of malignant neoplasm of breast: Secondary | ICD-10-CM | POA: Insufficient documentation

## 2020-08-25 HISTORY — PX: RADIOACTIVE SEED GUIDED EXCISIONAL BREAST BIOPSY: SHX6490

## 2020-08-25 SURGERY — RADIOACTIVE SEED GUIDED BREAST BIOPSY
Anesthesia: General | Site: Breast | Laterality: Left

## 2020-08-25 MED ORDER — ACETAMINOPHEN 500 MG PO TABS
1000.0000 mg | ORAL_TABLET | ORAL | Status: AC
  Administered 2020-08-25: 1000 mg via ORAL

## 2020-08-25 MED ORDER — EPHEDRINE SULFATE 50 MG/ML IJ SOLN
INTRAMUSCULAR | Status: DC | PRN
  Administered 2020-08-25: 10 mg via INTRAVENOUS

## 2020-08-25 MED ORDER — OXYCODONE HCL 5 MG PO TABS
5.0000 mg | ORAL_TABLET | Freq: Once | ORAL | Status: AC | PRN
  Administered 2020-08-25: 5 mg via ORAL

## 2020-08-25 MED ORDER — ONDANSETRON HCL 4 MG/2ML IJ SOLN
INTRAMUSCULAR | Status: DC | PRN
  Administered 2020-08-25: 4 mg via INTRAVENOUS

## 2020-08-25 MED ORDER — HYDROMORPHONE HCL 1 MG/ML IJ SOLN: 0.2500 mg | INTRAMUSCULAR | Status: DC | PRN

## 2020-08-25 MED ORDER — LIDOCAINE HCL (PF) 2 % IJ SOLN
INTRAMUSCULAR | Status: AC
  Filled 2020-08-25: qty 5

## 2020-08-25 MED ORDER — OXYCODONE HCL 5 MG PO TABS
ORAL_TABLET | ORAL | Status: AC
  Filled 2020-08-25: qty 1

## 2020-08-25 MED ORDER — BUPIVACAINE HCL (PF) 0.25 % IJ SOLN
INTRAMUSCULAR | Status: DC | PRN
  Administered 2020-08-25: 10 mL

## 2020-08-25 MED ORDER — FENTANYL CITRATE (PF) 100 MCG/2ML IJ SOLN
INTRAMUSCULAR | Status: AC
  Filled 2020-08-25: qty 2

## 2020-08-25 MED ORDER — LIDOCAINE HCL (CARDIAC) PF 100 MG/5ML IV SOSY
PREFILLED_SYRINGE | INTRAVENOUS | Status: DC | PRN
  Administered 2020-08-25: 60 mg via INTRAVENOUS

## 2020-08-25 MED ORDER — OXYCODONE HCL 5 MG/5ML PO SOLN: 5.0000 mg | Freq: Once | ORAL | Status: AC | PRN

## 2020-08-25 MED ORDER — MIDAZOLAM HCL 5 MG/5ML IJ SOLN
INTRAMUSCULAR | Status: DC | PRN
  Administered 2020-08-25: 2 mg via INTRAVENOUS

## 2020-08-25 MED ORDER — DEXAMETHASONE SODIUM PHOSPHATE 10 MG/ML IJ SOLN
INTRAMUSCULAR | Status: AC
  Filled 2020-08-25: qty 1

## 2020-08-25 MED ORDER — CEFAZOLIN SODIUM-DEXTROSE 2-4 GM/100ML-% IV SOLN
2.0000 g | INTRAVENOUS | Status: AC
  Administered 2020-08-25: 2 g via INTRAVENOUS

## 2020-08-25 MED ORDER — PROMETHAZINE HCL 25 MG/ML IJ SOLN
6.2500 mg | INTRAMUSCULAR | Status: DC | PRN
Start: 2020-08-25 — End: 2020-08-25

## 2020-08-25 MED ORDER — ONDANSETRON HCL 4 MG/2ML IJ SOLN
INTRAMUSCULAR | Status: AC
  Filled 2020-08-25: qty 2

## 2020-08-25 MED ORDER — CEFAZOLIN SODIUM-DEXTROSE 2-4 GM/100ML-% IV SOLN
INTRAVENOUS | Status: AC
  Filled 2020-08-25: qty 100

## 2020-08-25 MED ORDER — LACTATED RINGERS IV SOLN: INTRAVENOUS | Status: DC

## 2020-08-25 MED ORDER — MIDAZOLAM HCL 2 MG/2ML IJ SOLN
INTRAMUSCULAR | Status: AC
  Filled 2020-08-25: qty 2

## 2020-08-25 MED ORDER — PROPOFOL 10 MG/ML IV BOLUS
INTRAVENOUS | Status: DC | PRN
  Administered 2020-08-25: 200 mg via INTRAVENOUS

## 2020-08-25 MED ORDER — FENTANYL CITRATE (PF) 100 MCG/2ML IJ SOLN
INTRAMUSCULAR | Status: DC | PRN
  Administered 2020-08-25 (×2): 25 ug via INTRAVENOUS

## 2020-08-25 MED ORDER — EPHEDRINE 5 MG/ML INJ
INTRAVENOUS | Status: AC
  Filled 2020-08-25: qty 10

## 2020-08-25 MED ORDER — MEPERIDINE HCL 25 MG/ML IJ SOLN: 6.2500 mg | INTRAMUSCULAR | Status: DC | PRN

## 2020-08-25 MED ORDER — PROPOFOL 10 MG/ML IV BOLUS
INTRAVENOUS | Status: AC
  Filled 2020-08-25: qty 20

## 2020-08-25 MED ORDER — DROPERIDOL 2.5 MG/ML IJ SOLN
INTRAMUSCULAR | Status: DC | PRN
  Administered 2020-08-25: .625 mg via INTRAVENOUS

## 2020-08-25 MED ORDER — DEXAMETHASONE SODIUM PHOSPHATE 10 MG/ML IJ SOLN
INTRAMUSCULAR | Status: DC | PRN
  Administered 2020-08-25: 5 mg via INTRAVENOUS

## 2020-08-25 MED ORDER — ONDANSETRON HCL 4 MG PO TABS: 4.0000 mg | ORAL_TABLET | Freq: Every day | ORAL | 0 refills | Status: DC | PRN

## 2020-08-25 MED ORDER — DROPERIDOL 2.5 MG/ML IJ SOLN
INTRAMUSCULAR | Status: AC
  Filled 2020-08-25: qty 2

## 2020-08-25 MED ORDER — ACETAMINOPHEN 500 MG PO TABS
ORAL_TABLET | ORAL | Status: AC
  Filled 2020-08-25: qty 2

## 2020-08-25 SURGICAL SUPPLY — 44 items
ADH SKN CLS APL DERMABOND .7 (GAUZE/BANDAGES/DRESSINGS) ×1
APL PRP STRL LF DISP 70% ISPRP (MISCELLANEOUS) ×1
APPLIER CLIP 9.375 MED OPEN (MISCELLANEOUS)
APR CLP MED 9.3 20 MLT OPN (MISCELLANEOUS)
BINDER BREAST LRG (GAUZE/BANDAGES/DRESSINGS) IMPLANT
BINDER BREAST XLRG (GAUZE/BANDAGES/DRESSINGS) ×2 IMPLANT
BLADE SURG 15 STRL LF DISP TIS (BLADE) ×1 IMPLANT
BLADE SURG 15 STRL SS (BLADE) ×2
CANISTER SUC SOCK COL 7IN (MISCELLANEOUS) IMPLANT
CANISTER SUCT 1200ML W/VALVE (MISCELLANEOUS) IMPLANT
CHLORAPREP W/TINT 26 (MISCELLANEOUS) ×2 IMPLANT
CLIP APPLIE 9.375 MED OPEN (MISCELLANEOUS) IMPLANT
COVER BACK TABLE 60X90IN (DRAPES) ×2 IMPLANT
COVER MAYO STAND STRL (DRAPES) ×2 IMPLANT
COVER PROBE W GEL 5X96 (DRAPES) ×2 IMPLANT
DERMABOND ADVANCED (GAUZE/BANDAGES/DRESSINGS) ×1
DERMABOND ADVANCED .7 DNX12 (GAUZE/BANDAGES/DRESSINGS) ×1 IMPLANT
DRAPE LAPAROSCOPIC ABDOMINAL (DRAPES) ×2 IMPLANT
DRAPE UTILITY XL STRL (DRAPES) ×2 IMPLANT
ELECT COATED BLADE 2.86 ST (ELECTRODE) ×2 IMPLANT
ELECT REM PT RETURN 9FT ADLT (ELECTROSURGICAL) ×2
ELECTRODE REM PT RTRN 9FT ADLT (ELECTROSURGICAL) ×1 IMPLANT
GLOVE SURG ENC MOIS LTX SZ7 (GLOVE) ×4 IMPLANT
GLOVE SURG UNDER POLY LF SZ7.5 (GLOVE) ×2 IMPLANT
GOWN STRL REUS W/ TWL LRG LVL3 (GOWN DISPOSABLE) ×2 IMPLANT
GOWN STRL REUS W/TWL LRG LVL3 (GOWN DISPOSABLE) ×4
HEMOSTAT ARISTA ABSORB 3G PWDR (HEMOSTASIS) IMPLANT
KIT MARKER MARGIN INK (KITS) ×2 IMPLANT
NEEDLE HYPO 25X1 1.5 SAFETY (NEEDLE) ×2 IMPLANT
NS IRRIG 1000ML POUR BTL (IV SOLUTION) IMPLANT
PACK BASIN DAY SURGERY FS (CUSTOM PROCEDURE TRAY) ×2 IMPLANT
PENCIL SMOKE EVACUATOR (MISCELLANEOUS) ×2 IMPLANT
RETRACTOR ONETRAX LX 90X20 (MISCELLANEOUS) IMPLANT
SLEEVE SCD COMPRESS KNEE MED (STOCKING) ×2 IMPLANT
SPONGE LAP 4X18 RFD (DISPOSABLE) ×2 IMPLANT
STRIP CLOSURE SKIN 1/2X4 (GAUZE/BANDAGES/DRESSINGS) ×2 IMPLANT
SUT MON AB 5-0 PS2 18 (SUTURE) ×2 IMPLANT
SUT VIC AB 2-0 SH 27 (SUTURE) ×2
SUT VIC AB 2-0 SH 27XBRD (SUTURE) ×1 IMPLANT
SUT VIC AB 3-0 SH 27 (SUTURE) ×2
SUT VIC AB 3-0 SH 27X BRD (SUTURE) ×1 IMPLANT
SYR CONTROL 10ML LL (SYRINGE) ×2 IMPLANT
TOWEL GREEN STERILE FF (TOWEL DISPOSABLE) ×2 IMPLANT
TRAY FAXITRON CT DISP (TRAY / TRAY PROCEDURE) ×2 IMPLANT

## 2020-08-25 NOTE — Transfer of Care (Signed)
Immediate Anesthesia Transfer of Care Note  Patient: Deanna Dorsey  Procedure(s) Performed: RADIOACTIVE SEED GUIDED EXCISIONAL LEFT BREAST BIOPSY (Left: Breast)  Patient Location: PACU  Anesthesia Type:General  Level of Consciousness: awake, alert  and oriented  Airway & Oxygen Therapy: Patient Spontanous Breathing and Patient connected to face mask oxygen  Post-op Assessment: Report given to RN and Post -op Vital signs reviewed and stable  Post vital signs: Reviewed and stable  Last Vitals:  Vitals Value Taken Time  BP 159/70 08/25/20 1049  Temp    Pulse 88 08/25/20 1050  Resp 13 08/25/20 1050  SpO2 99 % 08/25/20 1050  Vitals shown include unvalidated device data.  Last Pain:  Vitals:   08/25/20 0905  TempSrc: Oral  PainSc: 0-No pain         Complications: No notable events documented.

## 2020-08-25 NOTE — Op Note (Signed)
Preoperative diagnosis: Left breast distortion with core biopsy csl Postoperative diagnosis: Same as above Procedure: Left breast radioactive seed guided excisional biopsy Surgeon: Dr. Serita Grammes Anesthesia: General Specimens:left breast tissue containing seed and clip Estimated blood loss: Minimal Complications: None Drains: None Sponge and count was correct at completion Decision to recovery in stable condition  Indications: 27 yof with no mass or dc, fh of a sister with bca and 4 maternal cousins, granddaughter with ovarian ca has no prior breast history. she had a screening mm that shows left breast distortion.  biopsy shows a csl.  Procedure: After informed consent was obtained the patient was given antibiotics and SCDs were in place.  She had a radioactive seed placed prior to beginning.  I had these mammograms in the operating room.  She was placed under general anesthesia without complication.  She was prepped and draped in the standard sterile surgical fashion.  Surgical timeout was then performed.  I made a periareolar incision after infiltrating Marcaine.  I did this in order to hide the scar later.  I then tunneled to the lesion.   I then identified the seed and remove the seed and the surrounding tissue in that attempt to get a clear margin around the lesion.  I then passed this off the table after marking it with paint Mammogram confirmed removal of the seed and the clip. Hemostasis was then obtained.  I then closed this with 2-0 Vicryl.  The skin was closed with 3-0 Vicryl and 5-0 Monocryl.  Glue and Steri-Strips were applied.  She tolerated this well was extubated and transferred recovery stable condition.

## 2020-08-25 NOTE — Anesthesia Preprocedure Evaluation (Signed)
Anesthesia Evaluation  Patient identified by MRN, date of birth, ID band Patient awake    Reviewed: Allergy & Precautions, NPO status , Patient's Chart, lab work & pertinent test results  History of Anesthesia Complications (+) PONV and history of anesthetic complications  Airway Mallampati: II  TM Distance: >3 FB Neck ROM: Full    Dental no notable dental hx. (+) Teeth Intact, Dental Advisory Given   Pulmonary sleep apnea ,    Pulmonary exam normal breath sounds clear to auscultation       Cardiovascular negative cardio ROS Normal cardiovascular exam Rhythm:Regular Rate:Normal     Neuro/Psych negative neurological ROS  negative psych ROS   GI/Hepatic negative GI ROS, Neg liver ROS,   Endo/Other  negative endocrine ROS  Renal/GU negative Renal ROS  negative genitourinary   Musculoskeletal  (+) Arthritis , Osteoarthritis,  Arthrofibrosis right knee   Abdominal Normal abdominal exam  (+)   Peds negative pediatric ROS (+)  Hematology negative hematology ROS (+)   Anesthesia Other Findings HLD  Reproductive/Obstetrics negative OB ROS                             Anesthesia Physical  Anesthesia Plan  ASA: II  Anesthesia Plan: General   Post-op Pain Management:    Induction: Intravenous  PONV Risk Score and Plan: 4 or greater and Ondansetron, Dexamethasone, Midazolam, Treatment may vary due to age or medical condition and Droperidol  Airway Management Planned: LMA  Additional Equipment: None  Intra-op Plan:   Post-operative Plan: Extubation in OR  Informed Consent: I have reviewed the patients History and Physical, chart, labs and discussed the procedure including the risks, benefits and alternatives for the proposed anesthesia with the patient or authorized representative who has indicated his/her understanding and acceptance.     Dental advisory given  Plan Discussed  with: CRNA  Anesthesia Plan Comments:         Anesthesia Quick Evaluation

## 2020-08-25 NOTE — H&P (Signed)
65 yof with no mass or dc, fh of a sister with bca and 4 maternal cousings, granddaughter with ovarian ca has no prior breast history. she had a screening mm that shows left breast distortion.  biopsy was done. the marking clip is 1.5 cm away.  biopsy shows a csl.  she is here to discuss options   Past Surgical History  Breast Biopsy   Left. Hysterectomy (not due to cancer) - Complete   Knee Surgery   Bilateral. Tonsillectomy    Diagnostic Studies History ( Colonoscopy   1-5 years ago  Allergies Doxycycline *DERMATOLOGICALS*   Allergies Reconciled    Medication History  NexIUM  (40MG  Capsule DR, Oral) Active. Atorvastatin Calcium  (10MG  Tablet, Oral) Active. Trelegy Ellipta  (100-62.5-25MCG/INH Aero Pow Br Act, Inhalation) Active. Medications Reconciled   Social History  Alcohol use   Occasional alcohol use. Caffeine use   Coffee, Tea. No drug use   Tobacco use   Never smoker.  Family History  Breast Cancer   Family Members In General, Sister. Cancer   Mother, Sister. Cervical Cancer   Daughter. Heart disease in female family member before age 83   Ovarian Cancer   Daughter.  Pregnancy / Birth History  Age at menarche   42 years. Age of menopause   14-50 Gravida   2 Maternal age   67-67  Other Problems  Arthritis   Back Pain   Depression   Gastroesophageal Reflux Disease   Heart murmur   High blood pressure   Hypercholesterolemia   Kidney Stone   Sleep Apnea     Review of Systems  General Present- Fatigue. Not Present- Appetite Loss, Chills, Fever, Night Sweats, Weight Gain and Weight Loss. Skin Not Present- Change in Wart/Mole, Dryness, Hives, Jaundice, New Lesions, Non-Healing Wounds, Rash and Ulcer. HEENT Present- Hoarseness, Seasonal Allergies and Wears glasses/contact lenses. Not Present- Earache, Hearing Loss, Nose Bleed, Oral Ulcers, Ringing in the Ears, Sinus Pain, Sore Throat, Visual Disturbances and Yellow Eyes. Cardiovascular Present- Palpitations  and Shortness of Breath. Not Present- Chest Pain, Difficulty Breathing Lying Down, Leg Cramps, Rapid Heart Rate and Swelling of Extremities. Gastrointestinal Not Present- Abdominal Pain, Bloating, Bloody Stool, Change in Bowel Habits, Chronic diarrhea, Constipation, Difficulty Swallowing, Excessive gas, Gets full quickly at meals, Hemorrhoids, Indigestion, Nausea, Rectal Pain and Vomiting. Female Genitourinary Not Present- Frequency, Nocturia, Painful Urination, Pelvic Pain and Urgency. Musculoskeletal Present- Back Pain. Not Present- Joint Pain, Joint Stiffness, Muscle Pain, Muscle Weakness and Swelling of Extremities. Neurological Present- Weakness. Not Present- Decreased Memory, Fainting, Headaches, Numbness, Seizures, Tingling, Tremor and Trouble walking. Psychiatric Present- Depression. Not Present- Anxiety, Bipolar, Change in Sleep Pattern, Fearful and Frequent crying. Endocrine Not Present- Cold Intolerance, Excessive Hunger, Hair Changes, Heat Intolerance, Hot flashes and New Diabetes. Hematology Not Present- Blood Thinners, Easy Bruising, Excessive bleeding, Gland problems, HIV and Persistent Infections.   Physical Exam  General Mental Status - Alert. Orientation - Oriented X3. Breast Nipples - No Discharge. Breast Lump - No Palpable Breast Mass. Lymphatic Head & Neck General Head & Neck Lymphatics: Bilateral - Description - Normal. Axillary General Axillary Region: Bilateral - Description - Normal. Note:  no Shannon adenopathy   Assessment & Plan  RADIAL SCAR OF LEFT BREAST (N64.89) Story: Left breast seed guided excisional biopsy discussed short term follow up vs surgery. local data with 3D imaging show about a 15-18% upgrade rate to atypia/even dcis. I think with her risk level it is reasonable to do excision with seed guidance.  we discussed surgery, recovery

## 2020-08-25 NOTE — Anesthesia Procedure Notes (Signed)
Procedure Name: LMA Insertion Date/Time: 08/25/2020 10:12 AM Performed by: Lavonia Dana, CRNA Pre-anesthesia Checklist: Patient identified, Emergency Drugs available, Suction available and Patient being monitored Patient Re-evaluated:Patient Re-evaluated prior to induction Oxygen Delivery Method: Circle system utilized Preoxygenation: Pre-oxygenation with 100% oxygen Induction Type: IV induction Ventilation: Mask ventilation without difficulty LMA: LMA inserted LMA Size: 4.0 Number of attempts: 1 Airway Equipment and Method: Bite block Placement Confirmation: positive ETCO2 Tube secured with: Tape Dental Injury: Teeth and Oropharynx as per pre-operative assessment

## 2020-08-25 NOTE — Interval H&P Note (Signed)
History and Physical Interval Note:  08/25/2020 9:55 AM  Deanna Dorsey  has presented today for surgery, with the diagnosis of LEFT BREAST MASS, FAMILY HISTORY OF BREAST CANCER.  The various methods of treatment have been discussed with the patient and family. After consideration of risks, benefits and other options for treatment, the patient has consented to  Procedure(s): RADIOACTIVE SEED GUIDED EXCISIONAL LEFT BREAST BIOPSY (Left) as a surgical intervention.  The patient's history has been reviewed, patient examined, no change in status, stable for surgery.  I have reviewed the patient's chart and labs.  Questions were answered to the patient's satisfaction.     Rolm Bookbinder

## 2020-08-25 NOTE — Discharge Instructions (Addendum)
[facility] Office Phone Number [phone] POST OP INSTRUCTIONS Take 400 mg of ibuprofen every 8 hours or 650 mg tylenol every 6 hours for next 72 hours then as needed. Use ice several times daily also. Always review your discharge instruction sheet given to you by the facility where your surgery was performed.  IF YOU HAVE DISABILITY OR FAMILY LEAVE FORMS, YOU MUST BRING THEM TO THE OFFICE FOR PROCESSING.  DO NOT GIVE THEM TO YOUR DOCTOR.  A prescription for pain medication may be given to you upon discharge.  Take your pain medication as prescribed, if needed.  If narcotic pain medicine is not needed, then you may take acetaminophen (Tylenol), naprosyn (Alleve) or ibuprofen (Advil) as needed. Take your usually prescribed medications unless otherwise directed If you need a refill on your pain medication, please contact your pharmacy.  They will contact our office to request authorization.  Prescriptions will not be filled after 5pm or on week-ends. You should eat very light the first 24 hours after surgery, such as soup, crackers, pudding, etc.  Resume your normal diet the day after surgery. Most patients will experience some swelling and bruising in the breast.  Ice packs and a good support bra will help.  Wear the breast binder provided or a sports bra for 72 hours day and night.  After that wear a sports bra during the day until you return to the office. Swelling and bruising can take several days to resolve.  It is common to experience some constipation if taking pain medication after surgery.  Increasing fluid intake and taking a stool softener will usually help or prevent this problem from occurring.  A mild laxative (Milk of Magnesia or Miralax) should be taken according to package directions if there are no bowel movements after 48 hours. Unless discharge instructions indicate otherwise, you may remove your bandages 48 hours after surgery and you may shower at that time.  You  may have steri-strips (small skin tapes) in place directly over the incision.  These strips should be left on the skin for 7-10 days and will come off on their own.  If your surgeon used skin glue on the incision, you may shower in 24 hours.  The glue will flake off over the next 2-3 weeks.  Any sutures or staples will be removed at the office during your follow-up visit. ACTIVITIES:  You may resume regular daily activities (gradually increasing) beginning the next day.  Wearing a good support bra or sports bra minimizes pain and swelling.  You may have sexual intercourse when it is comfortable. You may drive when you no longer are taking prescription pain medication, you can comfortably wear a seatbelt, and you can safely maneuver your car and apply brakes. RETURN TO WORK:  ______________________________________________________________________________________ Dennis Bast should see your doctor in the office for a follow-up appointment approximately two weeks after your surgery.  Your doctor's nurse will typically make your follow-up appointment when she calls you with your pathology report.  Expect your pathology report 3-4 business days after your surgery.  You may call to check if you do not hear from [place] after three days. OTHER INSTRUCTIONS: _______________________________________________________________________________________________ _____________________________________________________________________________________________________________________________________ _____________________________________________________________________________________________________________________________________ _____________________________________________________________________________________________________________________________________  WHEN TO CALL DR [place]: Fever over 101.0 Nausea and/or vomiting. Extreme swelling or bruising. Continued bleeding from incision. Increased pain, redness, or drainage from the  incision.  The clinic staff is available to answer your questions during regular business hours.  Please don't hesitate to call and ask to speak to  one of the nurses for clinical concerns.  If you have a medical emergency, go to the nearest emergency room or call 911.  A surgeon from [facility] is always on call at the hospital.  For further questions, please visit [email or web address]    No tylenol until after 3:15pm today.  Post Anesthesia Home Care Instructions  Activity: Get plenty of rest for the remainder of the day. A responsible individual must stay with you for 24 hours following the procedure.  For the next 24 hours, DO NOT: -Drive a car -Paediatric nurse -Drink alcoholic beverages -Take any medication unless instructed by your physician -Make any legal decisions or sign important papers.  Meals: Start with liquid foods such as gelatin or soup. Progress to regular foods as tolerated. Avoid greasy, spicy, heavy foods. If nausea and/or vomiting occur, drink only clear liquids until the nausea and/or vomiting subsides. Call your physician if vomiting continues.  Special Instructions/Symptoms: Your throat may feel dry or sore from the anesthesia or the breathing tube placed in your throat during surgery. If this causes discomfort, gargle with warm salt water. The discomfort should disappear within 24 hours.  If you had a scopolamine patch placed behind your ear for the management of post- operative nausea and/or vomiting:  1. The medication in the patch is effective for 72 hours, after which it should be removed.  Wrap patch in a tissue and discard in the trash. Wash hands thoroughly with soap and water. 2. You may remove the patch earlier than 72 hours if you experience unpleasant side effects which may include dry mouth, dizziness or visual disturbances. 3. Avoid touching the patch. Wash your hands with soap and water after contact with the patch.      Oxycodone 5mg  given at 1125 May have Tylenol after 3pm today

## 2020-08-25 NOTE — Anesthesia Postprocedure Evaluation (Signed)
Anesthesia Post Note  Patient: Deanna Dorsey  Procedure(s) Performed: RADIOACTIVE SEED GUIDED EXCISIONAL LEFT BREAST BIOPSY (Left: Breast)     Patient location during evaluation: PACU Anesthesia Type: General Level of consciousness: awake and alert Pain management: pain level controlled Vital Signs Assessment: post-procedure vital signs reviewed and stable Respiratory status: spontaneous breathing, nonlabored ventilation and respiratory function stable Cardiovascular status: blood pressure returned to baseline and stable Postop Assessment: no apparent nausea or vomiting Anesthetic complications: no   No notable events documented.  Last Vitals:  Vitals:   08/25/20 1100 08/25/20 1117  BP: (!) 143/81 (!) 150/85  Pulse: 75 71  Resp: 20 18  Temp:  (!) 36.4 C  SpO2: 97% 96%    Last Pain:  Vitals:   08/25/20 1112  TempSrc:   PainSc: [place]

## 2020-08-26 ENCOUNTER — Encounter (HOSPITAL_BASED_OUTPATIENT_CLINIC_OR_DEPARTMENT_OTHER): Payer: Self-pay | Admitting: General Surgery

## 2020-08-26 LAB — SURGICAL PATHOLOGY

## 2020-09-04 NOTE — Patient Instructions (Addendum)
DUE TO COVID-19 ONLY ONE VISITOR IS ALLOWED TO COME WITH YOU AND STAY IN THE WAITING ROOM ONLY DURING PRE OP AND PROCEDURE DAY OF SURGERY. THE 2 VISITORS  MAY VISIT WITH YOU AFTER SURGERY IN YOUR PRIVATE ROOM DURING VISITING HOURS ONLY!  YOU NEED TO HAVE A COVID 19 TEST ON_7/5______ @_2 :55 pm______, THIS TEST MUST BE DONE BEFORE SURGERY,  COVID TESTING SITE [place] [address], IT IS ON THE RIGHT GOING OUT [address] APPROXIMATELY  2 MINUTES PAST [employer] ON THE RIGHT. ONCE YOUR COVID TEST IS COMPLETED,  PLEASE BEGIN THE QUARANTINE INSTRUCTIONS AS OUTLINED IN YOUR HANDOUT.                Aubrie Lucien     Your procedure is scheduled on: 09/16/20   Report to [facility] Main  Entrance   Report to admitting at  8:00 AM     Call this number if you have problems the morning of surgery [place], [place].   No food after midnight.    You may have clear liquid until 7:30 AM.    At 7:00 AM drink pre surgery drink.   Nothing by mouth after 7:30 AM.    Take these medicines the morning of surgery with A SIP OF WATER: use flonase. Take nexium, use your inhalers and bring them with you. Bring your mask and tubing.                                You may not have any metal on your body including hair pins and              piercings  Do not wear jewelry, make-up, lotions, powders or perfumes, deodorant             Do not wear nail polish on your fingernails.  Do not shave  48 hours prior to surgery.             .   Do not bring valuables to the hospital. [place].  Contacts, dentures or bridgework may not be worn into surgery.                    Please read over the following fact sheets you were given: _____________________________________________________________________             [facility] -  Preparing for Surgery Before surgery, you can play an important role.  Because skin is not sterile, your skin needs to be as free of germs as possible.  You can reduce the number of germs on your skin by washing with CHG (chlorahexidine gluconate) soap before surgery.  CHG is an antiseptic cleaner which kills germs and bonds with the skin to continue killing germs even after washing. Please DO NOT use if you have an allergy to CHG or antibacterial soaps.  If your skin becomes reddened/irritated stop using the CHG and inform your nurse when you arrive at Short Stay. Do not shave (including legs and underarms) for at least 48 hours prior to the first CHG shower.   Please follow these instructions carefully:  1.  Shower with CHG Soap the night before surgery and  the  morning of Surgery.  2.  If you choose to wash your hair, wash your hair first as usual with your  normal  shampoo.  3.  After you shampoo, rinse your hair and body thoroughly to remove the  shampoo.                                       4.  Use CHG as you would any other liquid soap.  You can apply chg directly  to the skin and wash                       Gently with a scrungie or clean washcloth.  5.  Apply the CHG Soap to your body ONLY FROM THE NECK DOWN.   Do not use on face/ open                           Wound or open sores. Avoid contact with eyes, ears mouth and genitals (private parts).                       Wash face,  Genitals (private parts) with your normal soap.             6.  Wash thoroughly, paying special attention to the area where your surgery  will be performed.  7.  Thoroughly rinse your body with warm water from the neck down.  8.  DO NOT shower/wash with your normal soap after using and rinsing off  the CHG Soap.             9.  Pat yourself dry with a clean towel.            10.  Wear clean pajamas.            11.  Place clean sheets on your bed the night of your first shower and do not  sleep with pets. Day of  Surgery : Do not apply any lotions/deodorants the morning of surgery.  Please wear clean clothes to the hospital/surgery center.  FAILURE TO FOLLOW THESE INSTRUCTIONS MAY RESULT IN THE CANCELLATION OF YOUR SURGERY PATIENT SIGNATURE_________________________________  NURSE SIGNATURE__________________________________  ________________________________________________________________________   Adam Phenix  An incentive spirometer is a tool that can help keep your lungs clear and active. This tool measures how well you are filling your lungs with each breath. Taking long deep breaths may help reverse or decrease the chance of developing breathing (pulmonary) problems (especially infection) following: A long period of time when you are unable to move or be active. BEFORE THE PROCEDURE  If the spirometer includes an indicator to show your best effort, your nurse or respiratory therapist will set it to a desired goal. If possible, sit up straight or lean slightly forward. Try not to slouch. Hold the incentive spirometer in an upright position. INSTRUCTIONS FOR USE  Sit on the edge of your bed if possible, or sit up as far as you can in bed or on a chair. Hold the incentive spirometer in an upright position. Breathe out normally. Place the mouthpiece in your mouth and seal your lips tightly around it. Breathe in slowly and as deeply as possible, raising the piston or the ball toward the top of the column. Hold your breath for 3-5 seconds or for as long as  possible. Allow the piston or ball to fall to the bottom of the column. Remove the mouthpiece from your mouth and breathe out normally. Rest for a few seconds and repeat Steps 1 through 7 at least 10 times every 1-2 hours when you are awake. Take your time and take a few normal breaths between deep breaths. The spirometer may include an indicator to show your best effort. Use the indicator as a goal to work toward during each  repetition. After each set of 10 deep breaths, practice coughing to be sure your lungs are clear. If you have an incision (the cut made at the time of surgery), support your incision when coughing by placing a pillow or rolled up towels firmly against it. Once you are able to get out of bed, walk around indoors and cough well. You may stop using the incentive spirometer when instructed by your caregiver.  RISKS AND COMPLICATIONS Take your time so you do not get dizzy or light-headed. If you are in pain, you may need to take or ask for pain medication before doing incentive spirometry. It is harder to take a deep breath if you are having pain. AFTER USE Rest and breathe slowly and easily. It can be helpful to keep track of a log of your progress. Your caregiver can provide you with a simple table to help with this. If you are using the spirometer at home, follow these instructions: [place] IF:  You are having difficultly using the spirometer. You have trouble using the spirometer as often as instructed. Your pain medication is not giving enough relief while using the spirometer. You develop fever of 100.5 F (38.1 C) or higher. SEEK IMMEDIATE MEDICAL CARE IF:  You cough up bloody sputum that had not been present before. You develop fever of 102 F (38.9 C) or greater. You develop worsening pain at or near the incision site. MAKE SURE YOU:  Understand these instructions. Will watch your condition. Will get help right away if you are not doing well or get worse. Document Released: 07/11/2006 Document Revised: 05/23/2011 Document Reviewed: 09/11/2006 [facility] Patient Information 2014 [place], [place].   ________________________________________________________________________

## 2020-09-07 ENCOUNTER — Encounter (HOSPITAL_COMMUNITY)
Admission: RE | Admit: 2020-09-07 | Discharge: 2020-09-07 | Disposition: A | Discharge status: Home / Self Care | Payer: Medicare Other | Source: Ambulatory Visit | Attending: Orthopedic Surgery | Admitting: Orthopedic Surgery

## 2020-09-07 ENCOUNTER — Encounter (HOSPITAL_COMMUNITY): Payer: Self-pay

## 2020-09-07 ENCOUNTER — Other Ambulatory Visit: Payer: Self-pay

## 2020-09-07 DIAGNOSIS — Z01812 Encounter for preprocedural laboratory examination: Secondary | ICD-10-CM | POA: Insufficient documentation

## 2020-09-07 HISTORY — DX: Family history of other specified conditions: Z84.89

## 2020-09-07 LAB — CBC
HCT: 43.7 % (ref 36.0–46.0)
Hemoglobin: 14.7 g/dL (ref 12.0–15.0)
MCH: 31.9 pg (ref 26.0–34.0)
MCHC: 33.6 g/dL (ref 30.0–36.0)
MCV: 94.8 fL (ref 80.0–100.0)
Platelets: 313 10*3/uL (ref 150–400)
RBC: 4.61 MIL/uL (ref 3.87–5.11)
RDW: 13.5 % (ref 11.5–15.5)
WBC: 6.1 10*3/uL (ref 4.0–10.5)
nRBC: 0 % (ref 0.0–0.2)

## 2020-09-07 LAB — COMPREHENSIVE METABOLIC PANEL
ALT: 28 U/L (ref 0–44)
AST: 17 U/L (ref 15–41)
Albumin: 4.4 g/dL (ref 3.5–5.0)
Alkaline Phosphatase: 73 U/L (ref 38–126)
Anion gap: 9 (ref 5–15)
BUN: 17 mg/dL (ref 8–23)
CO2: 26 mmol/L (ref 22–32)
Calcium: 9.5 mg/dL (ref 8.9–10.3)
Chloride: 102 mmol/L (ref 98–111)
Creatinine, Ser: 0.6 mg/dL (ref 0.44–1.00)
GFR, Estimated: >60 mL/min (ref >60)
Glucose, Bld: 89 mg/dL (ref 70–99)
Potassium: 4 mmol/L (ref 3.5–5.1)
Sodium: 137 mmol/L (ref 135–145)
Total Bilirubin: 0.6 mg/dL (ref 0.3–1.2)
Total Protein: 7.4 g/dL (ref 6.5–8.1)

## 2020-09-07 LAB — PROTIME-INR
INR: 0.9 (ref 0.8–1.2)
Prothrombin Time: 12.1 seconds (ref 11.4–15.2)

## 2020-09-07 LAB — SURGICAL PCR SCREEN
MRSA, PCR: NEGATIVE
Staphylococcus aureus: NEGATIVE

## 2020-09-07 NOTE — Progress Notes (Signed)
COVID Vaccine Completed:yes Date COVID Vaccine completed:05/27/19- Pt had a reaction to the first vaccine and was advised not to get any more. COVID vaccine manufacturer: Pfizer    PCP - Darlina Sicilian at [facility] in [place]. LOV 08/05/20 Cardiologist - none Pulmonologist- Dr. Florencia Reasons  Chest x-ray - none Pulmonary function test:07/17/20- good results EKG - none Stress Test - no ECHO - no Cardiac Cath - no Pacemaker/ICD device last checked:NA  Sleep Study - yes. Had surgery. Doesn't need a C-pap CPAP - no  Fasting Blood Sugar - NA Checks Blood Sugar _____ times a day  Blood Thinner Instructions:NA Aspirin Instructions: Last Dose:  Anesthesia review:   Patient denies shortness of breath, fever, cough and chest pain at PAT appointment Yes. Pt had an allergy triggered asthma to the covid vaccine. She is improving and uses her inhalers.Pulmonary function results are in epic  Patient verbalized understanding of instructions that were given to them at the PAT appointment. Patient was also instructed that they will need to review over the PAT instructions again at home before surgery. yes

## 2020-09-09 NOTE — H&P (Signed)
TOTAL KNEE ADMISSION H&P  Patient is being admitted for left total knee arthroplasty.  Subjective:  Chief Complaint: Left knee pain.  HPI: Deanna Dorsey, 65 y.o. female has a history of pain and functional disability in the left knee due to arthritis and has failed non-surgical conservative treatments for greater than 12 weeks to include NSAID's and/or analgesics, flexibility and strengthening excercises, and activity modification. Onset of symptoms was gradual, starting  several  years ago with gradually worsening course since that time. The patient noted prior procedures on the knee to include  arthroscopy on the left knee.  Patient currently rates pain in the left knee at 6 out of 10 with activity. Patient has worsening of pain with activity and weight bearing, pain that interferes with activities of daily living, and pain with passive range of motion. Patient has evidence of periarticular osteophytes and joint space narrowing by imaging studies. There is no active infection.  Patient Active Problem List   Diagnosis Date Noted   Arthrofibrosis of total knee arthroplasty, initial encounter ([place]) 02/06/2019   Arthrofibrosis of total knee arthroplasty ([place]) 02/06/2019    Past Medical History:  Diagnosis Date   Arthritis    Asthma 07/17/2020   vaccine triggered,  allergy triggered asthma   Family history of adverse reaction to anesthesia    sister slow to wake up   History of kidney stones    1982   PONV (postoperative nausea and vomiting)    Sleep apnea 2003   had surgical fix    Past Surgical History:  Procedure Laterality Date   ABDOMINAL HYSTERECTOMY  2006   BACK SURGERY     2016   KNEE ARTHROTOMY Right 02/06/2019   Procedure: KNEE ARTHROTOMY; SCAR EXCISION;  Surgeon: Gaynelle Arabian, MD;  Location: [facility];  Service: Orthopedics;  Laterality: Right;  4min   KNEE CARTILAGE SURGERY Right 2006   MENISCUS REPAIR Right 2016   NASAL SINUS SURGERY  2004   RADIOACTIVE SEED GUIDED  EXCISIONAL BREAST BIOPSY Left 08/25/2020   Procedure: RADIOACTIVE SEED GUIDED EXCISIONAL LEFT BREAST BIOPSY;  Surgeon: Rolm Bookbinder, MD;  Location: [facility];  Service: General;  Laterality: Left;   REPLACEMENT TOTAL KNEE Right 2019   TONSILLECTOMY     2004    Prior to Admission medications   Medication Sig Start Date End Date Taking? Authorizing Provider  acetaminophen (TYLENOL) 500 MG tablet Take 1,000 mg by mouth every 6 (six) hours as needed for moderate pain.   Yes [provider]  atorvastatin (LIPITOR) 10 MG tablet Take 10 mg by mouth daily. 01/14/19  Yes [provider]  diclofenac Sodium (VOLTAREN) 1 % GEL Apply 2 g topically daily as needed (pain).   Yes [provider]  esomeprazole (NEXIUM) 40 MG capsule Take 40 mg by mouth daily.   Yes [provider]  fluticasone (FLONASE) 50 MCG/ACT nasal spray Place 1 spray into both nostrils daily. Patient taking differently: Place 1 spray into both nostrils in the morning. 08/12/20  Yes Hunsucker, Bonna Gains, MD  Fluticasone-Umeclidin-Vilant (TRELEGY ELLIPTA) 200-62.5-25 MCG/INH AEPB Inhale 1 puff into the lungs daily. Patient taking differently: Inhale 1 puff into the lungs in the morning and at bedtime. 07/17/20  Yes Hunsucker, Bonna Gains, MD  montelukast (SINGULAIR) 10 MG tablet Take 1 tablet (10 mg total) by mouth at bedtime. 08/12/20  Yes Hunsucker, Bonna Gains, MD  NONFORMULARY OR COMPOUNDED ITEM Take 5 mcg by mouth daily before breakfast. T3 SR 5 mcg  Yes [provider]  progesterone (ENDOMETRIN) 100 MG vaginal insert Place 100 mg vaginally daily.   Yes [provider]  ondansetron (ZOFRAN) 4 MG tablet Take 1 tablet (4 mg total) by mouth daily as needed for nausea or vomiting. Patient not taking: Reported on 09/02/2020 08/25/20 08/25/21  Rolm Bookbinder, MD    Allergies  Allergen Reactions   Doxycycline Anaphylaxis and Rash    Pt started swelling and breaking out  with rash per dr     Social History   Socioeconomic History   Marital status: Widowed    Spouse name: Not on file   Number of children: Not on file   Years of education: Not on file   Highest education level: Not on file  Occupational History   Not on file  Tobacco Use   Smoking status: Never   Smokeless tobacco: Never  Vaping Use   Vaping Use: Never used  Substance and Sexual Activity   Alcohol use: Yes    Alcohol/week: 1.0 standard drink    Types: 1 Glasses of wine per week    Comment: rare   Drug use: Never   Sexual activity: Not on file  Other Topics Concern   Not on file  Social History Narrative   Not on file   Social Determinants of Health   Financial Resource Strain: Not on file  Food Insecurity: Not on file  Transportation Needs: Not on file  Physical Activity: Not on file  Stress: Not on file  Social Connections: Not on file  Intimate Partner Violence: Not on file    Tobacco Use: Low Risk    Smoking Tobacco Use: Never   Smokeless Tobacco Use: Never   Social History   Substance and Sexual Activity  Alcohol Use Yes   Alcohol/week: 1.0 standard drink   Types: 1 Glasses of wine per week   Comment: rare    No family history on file.  ROS: Constitutional: no fever, no chills, no night sweats, no significant weight loss Cardiovascular: no chest pain, no palpitations Respiratory: no cough, no shortness of breath, No COPD Gastrointestinal: no vomiting, no nausea Musculoskeletal: no swelling in Joints, Joint Pain Neurologic: no numbness, no tingling, no difficulty with balance   Objective:  Physical Exam: Well nourished and well developed.  General: Alert and oriented x3, cooperative and pleasant, no acute distress.  Head: normocephalic, atraumatic, neck supple.  Eyes: EOMI.  Respiratory: breath sounds clear in all fields, no wheezing, rales, or rhonchi. Cardiovascular: Regular rate and rhythm, no murmurs, gallops or rubs.  Abdomen: non-tender  to palpation and soft, normoactive bowel sounds. Musculoskeletal:  Right Knee Exam:  No warmth or effusion.  Range of motion is 5 to 85 or 90 degrees.  No crepitus on range of motion of the knee.  Positive medial joint line tenderness.  Stable knee.    Left Knee Exam:  No effusion.  Range of motion is 0 to 130 degrees.  No crepitus on range of motion of the knee.  Slight medial joint line tenderness.  No lateral joint line tenderness.  Stable knee.    Sensation and motor function intact in LE. Distal pulses 2+. Calves soft and nontender.  Calves soft and nontender. Motor function intact in LE. Strength 5/5 LE bilaterally. Neuro: Distal pulses 2+. Sensation to light touch intact in LE.  Vital signs in last 24 hours:    Imaging Review Plain radiographs demonstrate mild degenerative joint disease of the left knee. The overall alignment is  mild varus. The bone quality appears to be good for age and reported activity level.  Assessment/Plan:  End stage arthritis, left knee   The patient history, physical examination, clinical judgment of the provider and imaging studies are consistent with end stage degenerative joint disease of the left knee and total knee arthroplasty is deemed medically necessary. The treatment options including medical management, injection therapy arthroscopy and arthroplasty were discussed at length. The risks and benefits of total knee arthroplasty were presented and reviewed. The risks due to aseptic loosening, infection, stiffness, patella tracking problems, thromboembolic complications and other imponderables were discussed. The patient acknowledged the explanation, agreed to proceed with the plan and consent was signed. Patient is being admitted for inpatient treatment for surgery, pain control, PT, OT, prophylactic antibiotics, VTE prophylaxis, progressive ambulation and ADLs and discharge planning. The patient is planning to be discharged  home  .   Patient's anticipated LOS is less than 2 midnights, meeting these requirements: - Lives within 1 hour of care - Has a competent adult at home to recover with post-op  - NO history of  - Chronic pain requiring opioids  - Diabetes  - Coronary Artery Disease  - Heart failure  - Heart attack  - Stroke  - DVT/VTE  - Cardiac arrhythmia  - Respiratory Failure/COPD  - Renal failure  - Anemia  - Advanced Liver disease  Therapy Plans: [facility] Disposition: Home with granddaughter Planned DVT Prophylaxis: Xarelto 10mg  (Hx of skin cancer) DME Needed: 3-in-1  PCP: Lanelle Bal, PA-C (clearance not received) - requested pulmonology clearance Pulmonology: Larey Days - clearance requesting.  TXA: IV Allergies: Doxycycline (anaphylaxis). Has had some episodes of skin irritation with bandaids, redness that develops around the areas of adhesive, but this is relatively new. Did not have reaction to aquacel in the past.   Anesthesia Concerns: N/V for 1-2 weeks following surgical procedures.  BMI: 28 Last HgbA1c: n/a  [facility] [address] [facility] [place] Need to send in nausea medicine for her - she says Zofran hasn't helped her in the past.  Has had itching with any pain medicine in the past.   Other: Prefers hydrocodone for pain medicine.     - Patient was instructed on what medications to stop prior to surgery. - Follow-up visit in 2 weeks with Dr. Wynelle Link - Begin physical therapy following surgery - Pre-operative lab work as pre-surgical testing - Prescriptions will be provided in hospital at time of discharge  Fenton Foy, [facility], PA-C Orthopedic Surgery [facility] [place]

## 2020-09-11 ENCOUNTER — Ambulatory Visit: Payer: Self-pay | Admitting: *Deleted

## 2020-09-11 NOTE — Telephone Encounter (Signed)
Pt called in to verify what time her Covid test was for today at the [facility] location.    I let her know it was 2:55.   She thanked me for my help.

## 2020-09-15 ENCOUNTER — Other Ambulatory Visit (HOSPITAL_COMMUNITY)
Admission: RE | Admit: 2020-09-15 | Discharge: 2020-09-15 | Disposition: A | Discharge status: Home / Self Care | Payer: Medicare Other | Source: Ambulatory Visit | Attending: Orthopedic Surgery | Admitting: Orthopedic Surgery

## 2020-09-15 DIAGNOSIS — Z01812 Encounter for preprocedural laboratory examination: Secondary | ICD-10-CM | POA: Insufficient documentation

## 2020-09-15 DIAGNOSIS — Z20822 Contact with and (suspected) exposure to covid-19: Secondary | ICD-10-CM | POA: Insufficient documentation

## 2020-09-15 MED ORDER — BUPIVACAINE LIPOSOME 1.3 % IJ SUSP
20.0000 mL | Freq: Once | INTRAMUSCULAR | Status: DC
  Filled 2020-09-15: qty 20

## 2020-09-16 ENCOUNTER — Inpatient Hospital Stay (HOSPITAL_COMMUNITY): Payer: Medicare Other | Admitting: Certified Registered"

## 2020-09-16 ENCOUNTER — Other Ambulatory Visit: Payer: Self-pay

## 2020-09-16 ENCOUNTER — Inpatient Hospital Stay (HOSPITAL_COMMUNITY)
Admission: RE | Admit: 2020-09-16 | Discharge: 2020-09-18 | DRG: 470 | Disposition: A | Discharge status: Home / Self Care | Payer: Medicare Other | Attending: Orthopedic Surgery | Admitting: Orthopedic Surgery

## 2020-09-16 ENCOUNTER — Encounter (HOSPITAL_COMMUNITY)
Admission: RE | Disposition: A | Discharge status: Home / Self Care | Payer: Self-pay | Source: Home / Self Care | Attending: Orthopedic Surgery

## 2020-09-16 ENCOUNTER — Encounter (HOSPITAL_COMMUNITY): Payer: Self-pay | Admitting: Orthopedic Surgery

## 2020-09-16 ENCOUNTER — Inpatient Hospital Stay (HOSPITAL_COMMUNITY): Payer: Medicare Other | Admitting: Physician Assistant

## 2020-09-16 DIAGNOSIS — M1712 Unilateral primary osteoarthritis, left knee: Principal | ICD-10-CM | POA: Diagnosis present

## 2020-09-16 DIAGNOSIS — Z96652 Presence of left artificial knee joint: Secondary | ICD-10-CM

## 2020-09-16 DIAGNOSIS — Z79899 Other long term (current) drug therapy: Secondary | ICD-10-CM

## 2020-09-16 DIAGNOSIS — Z20822 Contact with and (suspected) exposure to covid-19: Secondary | ICD-10-CM | POA: Diagnosis present

## 2020-09-16 DIAGNOSIS — Z888 Allergy status to other drugs, medicaments and biological substances status: Secondary | ICD-10-CM

## 2020-09-16 DIAGNOSIS — M179 Osteoarthritis of knee, unspecified: Secondary | ICD-10-CM | POA: Diagnosis present

## 2020-09-16 DIAGNOSIS — M1710 Unilateral primary osteoarthritis, unspecified knee: Secondary | ICD-10-CM

## 2020-09-16 HISTORY — PX: TOTAL KNEE ARTHROPLASTY: SHX125

## 2020-09-16 LAB — SARS CORONAVIRUS 2 (TAT 6-24 HRS): SARS Coronavirus 2: NEGATIVE

## 2020-09-16 SURGERY — ARTHROPLASTY, KNEE, TOTAL
Anesthesia: Spinal | Site: Knee | Laterality: Left

## 2020-09-16 MED ORDER — PHENYLEPHRINE HCL-NACL 10-0.9 MG/250ML-% IV SOLN
INTRAVENOUS | Status: DC | PRN
  Administered 2020-09-16: 30 ug/min via INTRAVENOUS

## 2020-09-16 MED ORDER — MENTHOL 3 MG MT LOZG: 1.0000 | LOZENGE | OROMUCOSAL | Status: DC | PRN

## 2020-09-16 MED ORDER — CEFAZOLIN SODIUM-DEXTROSE 2-4 GM/100ML-% IV SOLN
2.0000 g | INTRAVENOUS | Status: AC
  Administered 2020-09-16: 2 g via INTRAVENOUS
  Filled 2020-09-16: qty 100

## 2020-09-16 MED ORDER — SODIUM CHLORIDE (PF) 0.9 % IJ SOLN
INTRAMUSCULAR | Status: DC | PRN
  Administered 2020-09-16: 60 mL

## 2020-09-16 MED ORDER — PROPOFOL 10 MG/ML IV BOLUS
INTRAVENOUS | Status: DC | PRN
  Administered 2020-09-16 (×2): 20 mg via INTRAVENOUS
  Administered 2020-09-16: 25 mg via INTRAVENOUS
  Administered 2020-09-16: 30 mg via INTRAVENOUS

## 2020-09-16 MED ORDER — METHOCARBAMOL 500 MG IVPB - SIMPLE MED
INTRAVENOUS | Status: AC
  Filled 2020-09-16: qty 50

## 2020-09-16 MED ORDER — DEXAMETHASONE SODIUM PHOSPHATE 10 MG/ML IJ SOLN
10.0000 mg | Freq: Once | INTRAMUSCULAR | Status: AC
  Administered 2020-09-17: 10 mg via INTRAVENOUS
  Filled 2020-09-16: qty 1

## 2020-09-16 MED ORDER — SODIUM CHLORIDE 0.9 % IV SOLN: INTRAVENOUS | Status: DC

## 2020-09-16 MED ORDER — ROPIVACAINE HCL 5 MG/ML IJ SOLN
INTRAMUSCULAR | Status: DC | PRN
  Administered 2020-09-16 (×2): 5 mL via PERINEURAL

## 2020-09-16 MED ORDER — PHENOL 1.4 % MT LIQD: 1.0000 | OROMUCOSAL | Status: DC | PRN

## 2020-09-16 MED ORDER — ACETAMINOPHEN 10 MG/ML IV SOLN
1000.0000 mg | Freq: Four times a day (QID) | INTRAVENOUS | Status: DC
  Administered 2020-09-16: 1000 mg via INTRAVENOUS
  Filled 2020-09-16: qty 100

## 2020-09-16 MED ORDER — CLONIDINE HCL (ANALGESIA) 100 MCG/ML EP SOLN
EPIDURAL | Status: DC | PRN
  Administered 2020-09-16: 100 ug

## 2020-09-16 MED ORDER — FENTANYL CITRATE (PF) 100 MCG/2ML IJ SOLN
50.0000 ug | Freq: Once | INTRAMUSCULAR | Status: AC
  Administered 2020-09-16: 100 ug via INTRAVENOUS
  Filled 2020-09-16: qty 2

## 2020-09-16 MED ORDER — SODIUM CHLORIDE 0.9 % IR SOLN
Status: DC | PRN
  Administered 2020-09-16 (×2): 1000 mL

## 2020-09-16 MED ORDER — ATORVASTATIN CALCIUM 10 MG PO TABS
10.0000 mg | ORAL_TABLET | Freq: Every day | ORAL | Status: DC
  Administered 2020-09-16 – 2020-09-17 (×2): 10 mg via ORAL
  Filled 2020-09-16 (×2): qty 1

## 2020-09-16 MED ORDER — PROPOFOL 1000 MG/100ML IV EMUL
INTRAVENOUS | Status: AC
  Filled 2020-09-16: qty 100

## 2020-09-16 MED ORDER — METOCLOPRAMIDE HCL 5 MG/ML IJ SOLN
5.0000 mg | Freq: Three times a day (TID) | INTRAMUSCULAR | Status: DC | PRN
  Administered 2020-09-16 – 2020-09-17 (×2): 10 mg via INTRAVENOUS
  Filled 2020-09-16 (×2): qty 2

## 2020-09-16 MED ORDER — CEFAZOLIN SODIUM-DEXTROSE 2-4 GM/100ML-% IV SOLN
2.0000 g | Freq: Four times a day (QID) | INTRAVENOUS | Status: AC
  Administered 2020-09-16 (×2): 2 g via INTRAVENOUS
  Filled 2020-09-16 (×2): qty 100

## 2020-09-16 MED ORDER — MORPHINE SULFATE (PF) 2 MG/ML IV SOLN: 0.5000 mg | INTRAVENOUS | Status: DC | PRN

## 2020-09-16 MED ORDER — MONTELUKAST SODIUM 10 MG PO TABS
10.0000 mg | ORAL_TABLET | Freq: Every day | ORAL | Status: DC
  Administered 2020-09-16 – 2020-09-17 (×2): 10 mg via ORAL
  Filled 2020-09-16 (×2): qty 1

## 2020-09-16 MED ORDER — DEXAMETHASONE SODIUM PHOSPHATE 10 MG/ML IJ SOLN
INTRAMUSCULAR | Status: DC | PRN
  Administered 2020-09-16: 10 mg via INTRAVENOUS

## 2020-09-16 MED ORDER — POLYETHYLENE GLYCOL 3350 17 G PO PACK: 17.0000 g | PACK | Freq: Every day | ORAL | Status: DC | PRN

## 2020-09-16 MED ORDER — ACETAMINOPHEN 10 MG/ML IV SOLN: 1000.0000 mg | Freq: Once | INTRAVENOUS | Status: DC | PRN

## 2020-09-16 MED ORDER — KETOROLAC TROMETHAMINE 15 MG/ML IJ SOLN
INTRAMUSCULAR | Status: AC
  Filled 2020-09-16: qty 1

## 2020-09-16 MED ORDER — DEXAMETHASONE SODIUM PHOSPHATE 10 MG/ML IJ SOLN
INTRAMUSCULAR | Status: AC
  Filled 2020-09-16: qty 1

## 2020-09-16 MED ORDER — FENTANYL CITRATE (PF) 100 MCG/2ML IJ SOLN
25.0000 ug | INTRAMUSCULAR | Status: DC | PRN
  Administered 2020-09-16 (×3): 50 ug via INTRAVENOUS

## 2020-09-16 MED ORDER — ONDANSETRON HCL 4 MG/2ML IJ SOLN
INTRAMUSCULAR | Status: AC
  Filled 2020-09-16: qty 2

## 2020-09-16 MED ORDER — SODIUM CHLORIDE (PF) 0.9 % IJ SOLN
INTRAMUSCULAR | Status: AC
  Filled 2020-09-16: qty 10

## 2020-09-16 MED ORDER — GABAPENTIN 300 MG PO CAPS
300.0000 mg | ORAL_CAPSULE | Freq: Three times a day (TID) | ORAL | Status: DC
  Filled 2020-09-16 (×3): qty 1

## 2020-09-16 MED ORDER — RIVAROXABAN 10 MG PO TABS
10.0000 mg | ORAL_TABLET | Freq: Every day | ORAL | Status: DC
  Administered 2020-09-17 – 2020-09-18 (×2): 10 mg via ORAL
  Filled 2020-09-16 (×2): qty 1

## 2020-09-16 MED ORDER — METHOCARBAMOL 500 MG IVPB - SIMPLE MED
500.0000 mg | Freq: Four times a day (QID) | INTRAVENOUS | Status: DC | PRN
  Administered 2020-09-16: 500 mg via INTRAVENOUS
  Filled 2020-09-16: qty 50

## 2020-09-16 MED ORDER — FLUTICASONE FUROATE-VILANTEROL 200-25 MCG/INH IN AEPB
1.0000 | INHALATION_SPRAY | Freq: Every day | RESPIRATORY_TRACT | Status: DC
  Administered 2020-09-17 – 2020-09-18 (×2): 1 via RESPIRATORY_TRACT
  Filled 2020-09-16: qty 28

## 2020-09-16 MED ORDER — HYDROCODONE-ACETAMINOPHEN 5-325 MG PO TABS
1.0000 | ORAL_TABLET | ORAL | Status: DC | PRN
  Administered 2020-09-16 – 2020-09-18 (×10): 2 via ORAL
  Filled 2020-09-16 (×10): qty 2

## 2020-09-16 MED ORDER — ORAL CARE MOUTH RINSE: 15.0000 mL | Freq: Once | OROMUCOSAL | Status: AC

## 2020-09-16 MED ORDER — TRAMADOL HCL 50 MG PO TABS
50.0000 mg | ORAL_TABLET | Freq: Four times a day (QID) | ORAL | Status: DC | PRN
  Administered 2020-09-16: 100 mg via ORAL
  Administered 2020-09-17: 50 mg via ORAL
  Administered 2020-09-17 – 2020-09-18 (×3): 100 mg via ORAL
  Filled 2020-09-16 (×5): qty 2

## 2020-09-16 MED ORDER — DEXAMETHASONE SODIUM PHOSPHATE 10 MG/ML IJ SOLN: 8.0000 mg | Freq: Once | INTRAMUSCULAR | Status: DC

## 2020-09-16 MED ORDER — BUPIVACAINE IN DEXTROSE 0.75-8.25 % IT SOLN
INTRATHECAL | Status: DC | PRN
  Administered 2020-09-16: 1.4 mL via INTRATHECAL

## 2020-09-16 MED ORDER — ONDANSETRON HCL 4 MG/2ML IJ SOLN
4.0000 mg | Freq: Once | INTRAMUSCULAR | Status: AC | PRN
  Administered 2020-09-16: 4 mg via INTRAVENOUS

## 2020-09-16 MED ORDER — PANTOPRAZOLE SODIUM 40 MG PO TBEC
40.0000 mg | DELAYED_RELEASE_TABLET | Freq: Every day | ORAL | Status: DC
  Administered 2020-09-16 – 2020-09-18 (×3): 40 mg via ORAL
  Filled 2020-09-16 (×3): qty 1

## 2020-09-16 MED ORDER — MEPERIDINE HCL 50 MG/ML IJ SOLN: 6.2500 mg | INTRAMUSCULAR | Status: DC | PRN

## 2020-09-16 MED ORDER — LACTATED RINGERS IV SOLN: INTRAVENOUS | Status: DC

## 2020-09-16 MED ORDER — MIDAZOLAM HCL 2 MG/2ML IJ SOLN
1.0000 mg | Freq: Once | INTRAMUSCULAR | Status: AC
  Administered 2020-09-16: 2 mg via INTRAVENOUS
  Filled 2020-09-16: qty 2

## 2020-09-16 MED ORDER — PHENYLEPHRINE HCL (PRESSORS) 10 MG/ML IV SOLN
INTRAVENOUS | Status: AC
  Filled 2020-09-16: qty 1

## 2020-09-16 MED ORDER — DIPHENHYDRAMINE HCL 12.5 MG/5ML PO ELIX
12.5000 mg | ORAL_SOLUTION | ORAL | Status: DC | PRN
  Administered 2020-09-16 – 2020-09-17 (×3): 25 mg via ORAL
  Administered 2020-09-17: 12.5 mg via ORAL
  Administered 2020-09-18 (×2): 25 mg via ORAL
  Filled 2020-09-16 (×5): qty 10
  Filled 2020-09-16 (×2): qty 5

## 2020-09-16 MED ORDER — SCOPOLAMINE 1 MG/3DAYS TD PT72
MEDICATED_PATCH | TRANSDERMAL | Status: AC
  Filled 2020-09-16: qty 1

## 2020-09-16 MED ORDER — ONDANSETRON HCL 4 MG/2ML IJ SOLN
4.0000 mg | Freq: Four times a day (QID) | INTRAMUSCULAR | Status: DC | PRN
  Administered 2020-09-17: 4 mg via INTRAVENOUS
  Filled 2020-09-16: qty 2

## 2020-09-16 MED ORDER — SCOPOLAMINE 1 MG/3DAYS TD PT72
MEDICATED_PATCH | TRANSDERMAL | Status: DC | PRN
  Administered 2020-09-16: 1 via TRANSDERMAL

## 2020-09-16 MED ORDER — TRANEXAMIC ACID-NACL 1000-0.7 MG/100ML-% IV SOLN
1000.0000 mg | INTRAVENOUS | Status: AC
  Administered 2020-09-16: 1000 mg via INTRAVENOUS
  Filled 2020-09-16: qty 100

## 2020-09-16 MED ORDER — CHLORHEXIDINE GLUCONATE 0.12 % MT SOLN
15.0000 mL | Freq: Once | OROMUCOSAL | Status: AC
Start: 2020-09-16 — End: 2020-09-16
  Administered 2020-09-16: 15 mL via OROMUCOSAL

## 2020-09-16 MED ORDER — METHOCARBAMOL 500 MG PO TABS
500.0000 mg | ORAL_TABLET | Freq: Four times a day (QID) | ORAL | Status: DC | PRN
  Administered 2020-09-16 – 2020-09-17 (×4): 500 mg via ORAL
  Filled 2020-09-16 (×4): qty 1

## 2020-09-16 MED ORDER — PROPOFOL 500 MG/50ML IV EMUL
INTRAVENOUS | Status: DC | PRN
  Administered 2020-09-16: 70 ug/kg/min via INTRAVENOUS

## 2020-09-16 MED ORDER — BUPIVACAINE LIPOSOME 1.3 % IJ SUSP
INTRAMUSCULAR | Status: DC | PRN
  Administered 2020-09-16: 20 mL

## 2020-09-16 MED ORDER — ROPIVACAINE HCL 7.5 MG/ML IJ SOLN
INTRAMUSCULAR | Status: DC | PRN
  Administered 2020-09-16 (×4): 5 mL via PERINEURAL

## 2020-09-16 MED ORDER — FLUTICASONE-UMECLIDIN-VILANT 200-62.5-25 MCG/INH IN AEPB: 1.0000 | INHALATION_SPRAY | Freq: Every day | RESPIRATORY_TRACT | Status: DC

## 2020-09-16 MED ORDER — GLYCOPYRROLATE PF 0.2 MG/ML IJ SOSY
PREFILLED_SYRINGE | INTRAMUSCULAR | Status: AC
  Filled 2020-09-16: qty 1

## 2020-09-16 MED ORDER — FENTANYL CITRATE (PF) 100 MCG/2ML IJ SOLN
INTRAMUSCULAR | Status: AC
  Filled 2020-09-16: qty 4

## 2020-09-16 MED ORDER — UMECLIDINIUM BROMIDE 62.5 MCG/INH IN AEPB
1.0000 | INHALATION_SPRAY | Freq: Every day | RESPIRATORY_TRACT | Status: DC
  Administered 2020-09-17 – 2020-09-18 (×2): 1 via RESPIRATORY_TRACT
  Filled 2020-09-16: qty 7

## 2020-09-16 MED ORDER — KETOROLAC TROMETHAMINE 15 MG/ML IJ SOLN
15.0000 mg | Freq: Once | INTRAMUSCULAR | Status: AC
  Administered 2020-09-16: 15 mg via INTRAVENOUS

## 2020-09-16 MED ORDER — DOCUSATE SODIUM 100 MG PO CAPS
100.0000 mg | ORAL_CAPSULE | Freq: Two times a day (BID) | ORAL | Status: DC
  Administered 2020-09-16 – 2020-09-18 (×4): 100 mg via ORAL
  Filled 2020-09-16 (×4): qty 1

## 2020-09-16 MED ORDER — FLUTICASONE PROPIONATE 50 MCG/ACT NA SUSP
1.0000 | Freq: Every day | NASAL | Status: DC
  Administered 2020-09-16 – 2020-09-18 (×3): 1 via NASAL
  Filled 2020-09-16: qty 16

## 2020-09-16 MED ORDER — DEXAMETHASONE SODIUM PHOSPHATE 10 MG/ML IJ SOLN
INTRAMUSCULAR | Status: DC | PRN
  Administered 2020-09-16: 10 mg

## 2020-09-16 MED ORDER — PROGESTERONE 100 MG VA INST: 100.0000 mg | VAGINAL_INSERT | Freq: Every day | VAGINAL | Status: DC

## 2020-09-16 MED ORDER — FLEET ENEMA 7-19 GM/118ML RE ENEM: 1.0000 | ENEMA | Freq: Once | RECTAL | Status: DC | PRN

## 2020-09-16 MED ORDER — BISACODYL 10 MG RE SUPP: 10.0000 mg | Freq: Every day | RECTAL | Status: DC | PRN

## 2020-09-16 MED ORDER — ONDANSETRON HCL 4 MG PO TABS
4.0000 mg | ORAL_TABLET | Freq: Four times a day (QID) | ORAL | Status: DC | PRN
Start: 2020-09-16 — End: 2020-09-18
  Administered 2020-09-17 – 2020-09-18 (×5): 4 mg via ORAL
  Filled 2020-09-16 (×5): qty 1

## 2020-09-16 MED ORDER — POVIDONE-IODINE 10 % EX SWAB
2.0000 "application " | Freq: Once | CUTANEOUS | Status: AC
  Administered 2020-09-16: 2 via TOPICAL

## 2020-09-16 MED ORDER — ONDANSETRON HCL 4 MG/2ML IJ SOLN
INTRAMUSCULAR | Status: DC | PRN
  Administered 2020-09-16: 4 mg via INTRAVENOUS

## 2020-09-16 MED ORDER — METOCLOPRAMIDE HCL 5 MG PO TABS
5.0000 mg | ORAL_TABLET | Freq: Three times a day (TID) | ORAL | Status: DC | PRN
  Administered 2020-09-17: 10 mg via ORAL
  Filled 2020-09-16: qty 2

## 2020-09-16 SURGICAL SUPPLY — 57 items
ATTUNE PSFEM LTSZ4 NARCEM KNEE (Femur) ×2 IMPLANT
ATTUNE PSRP INSR SZ4 7 KNEE (Insert) ×2 IMPLANT
BAG COUNTER SPONGE SURGICOUNT (BAG) ×2 IMPLANT
BAG DECANTER FOR FLEXI CONT (MISCELLANEOUS) ×2 IMPLANT
BAG ZIPLOCK 12X15 (MISCELLANEOUS) ×2 IMPLANT
BASE TIBIAL ROT PLAT SZ 3 KNEE (Knees) ×1 IMPLANT
BLADE SAG 18X100X1.27 (BLADE) ×2 IMPLANT
BLADE SAW SGTL 11.0X1.19X90.0M (BLADE) ×2 IMPLANT
BNDG ELASTIC 6X5.8 VLCR STR LF (GAUZE/BANDAGES/DRESSINGS) ×2 IMPLANT
BOWL SMART MIX CTS (DISPOSABLE) ×2 IMPLANT
CEMENT HV SMART SET (Cement) ×4 IMPLANT
COVER SURGICAL LIGHT HANDLE (MISCELLANEOUS) ×2 IMPLANT
CUFF TOURN SGL QUICK 34 (TOURNIQUET CUFF) ×1
CUFF TRNQT CYL 34X4.125X (TOURNIQUET CUFF) ×1 IMPLANT
DECANTER SPIKE VIAL GLASS SM (MISCELLANEOUS) IMPLANT
DRAPE U-SHAPE 47X51 STRL (DRAPES) ×2 IMPLANT
DRSG AQUACEL AG ADV 3.5X10 (GAUZE/BANDAGES/DRESSINGS) ×2 IMPLANT
DURAPREP 26ML APPLICATOR (WOUND CARE) ×2 IMPLANT
ELECT REM PT RETURN 15FT ADLT (MISCELLANEOUS) ×2 IMPLANT
GLOVE SRG 8 PF TXTR STRL LF DI (GLOVE) ×1 IMPLANT
GLOVE SURG ENC MOIS LTX SZ6.5 (GLOVE) ×2 IMPLANT
GLOVE SURG ENC MOIS LTX SZ8 (GLOVE) ×4 IMPLANT
GLOVE SURG UNDER POLY LF SZ7 (GLOVE) ×2 IMPLANT
GLOVE SURG UNDER POLY LF SZ8 (GLOVE) ×1
GLOVE SURG UNDER POLY LF SZ8.5 (GLOVE) ×2 IMPLANT
GOWN STRL REUS W/TWL LRG LVL3 (GOWN DISPOSABLE) ×4 IMPLANT
GOWN STRL REUS W/TWL XL LVL3 (GOWN DISPOSABLE) ×2 IMPLANT
HANDPIECE INTERPULSE COAX TIP (DISPOSABLE) ×1
HOLDER FOLEY CATH W/STRAP (MISCELLANEOUS) ×2 IMPLANT
IMMOBILIZER KNEE 20 (SOFTGOODS) ×2
IMMOBILIZER KNEE 20 THIGH 36 (SOFTGOODS) ×1 IMPLANT
KIT TURNOVER KIT A (KITS) ×2 IMPLANT
MANIFOLD NEPTUNE II (INSTRUMENTS) ×2 IMPLANT
NS IRRIG 1000ML POUR BTL (IV SOLUTION) ×2 IMPLANT
PACK TOTAL KNEE CUSTOM (KITS) ×2 IMPLANT
PADDING CAST COTTON 6X4 STRL (CAST SUPPLIES) ×2 IMPLANT
PATELLA MEDIAL ATTUN 35MM KNEE (Knees) ×2 IMPLANT
PENCIL SMOKE EVACUATOR (MISCELLANEOUS) ×2 IMPLANT
PIN DRILL FIX HALF THREAD (BIT) ×2 IMPLANT
PIN STEINMAN FIXATION KNEE (PIN) ×2 IMPLANT
PROTECTOR NERVE ULNAR (MISCELLANEOUS) ×2 IMPLANT
SET HNDPC FAN SPRY TIP SCT (DISPOSABLE) ×1 IMPLANT
SLEEVE SCD COMPRESS KNEE MED (STOCKING) ×2 IMPLANT
SPONGE T-LAP 18X18 ~~LOC~~+RFID (SPONGE) ×6 IMPLANT
STRIP CLOSURE SKIN 1/2X4 (GAUZE/BANDAGES/DRESSINGS) ×4 IMPLANT
SUT MNCRL AB 4-0 PS2 18 (SUTURE) ×2 IMPLANT
SUT STRATAFIX 0 PDS 27 VIOLET (SUTURE) ×2
SUT VIC AB 2-0 CT1 27 (SUTURE) ×3
SUT VIC AB 2-0 CT1 TAPERPNT 27 (SUTURE) ×3 IMPLANT
SUTURE STRATFX 0 PDS 27 VIOLET (SUTURE) ×1 IMPLANT
TIBIAL BASE ROT PLAT SZ 3 KNEE (Knees) ×2 IMPLANT
TRAY FOLEY MTR SLVR 14FR STAT (SET/KITS/TRAYS/PACK) ×2 IMPLANT
TRAY FOLEY MTR SLVR 16FR STAT (SET/KITS/TRAYS/PACK) IMPLANT
TUBE SUCTION HIGH CAP CLEAR NV (SUCTIONS) ×2 IMPLANT
WATER STERILE IRR 1000ML POUR (IV SOLUTION) ×4 IMPLANT
WRAP KNEE MAXI GEL POST OP (GAUZE/BANDAGES/DRESSINGS) ×2 IMPLANT
YANKAUER SUCT BULB TIP NO VENT (SUCTIONS) ×2 IMPLANT

## 2020-09-16 NOTE — Care Plan (Signed)
Ortho Bundle Case Management Note  Patient Details  Name: Deanna Dorsey MRN: [id number] Date of Birth: [birth year]                  L TKA on 09-16-20 DCP: Home with granddtr. 1 story home with ramp entrance. DME: Has a RW. 3-in-1 ordered through [place] PT: [facility]. Referral faxed for [facility] 09-21-20.   DME Arranged:  3-N-1 DME Agency:  [facility]  HH Arranged:    [place] Agency:     Additional Comments: Please contact me with any questions of if this plan should need to change.  Marianne Sofia, RN,CCM [facility]  [phone] 09/16/2020, 8:32 AM

## 2020-09-16 NOTE — Anesthesia Procedure Notes (Signed)
Spinal  Patient location during procedure: OR Start time: 09/16/2020 10:24 AM End time: 09/16/2020 10:27 AM Reason for block: surgical anesthesia Staffing Performed: anesthesiologist  Anesthesiologist: Lyn Hollingshead, MD Preanesthetic Checklist Completed: patient identified, IV checked, site marked, risks and benefits discussed, surgical consent, monitors and equipment checked, pre-op evaluation and timeout performed Spinal Block Patient position: sitting Prep: DuraPrep and site prepped and draped Patient monitoring: continuous pulse ox and blood pressure Approach: midline Location: L3-4 Injection technique: single-shot Needle Needle type: Pencan  Needle gauge: 24 G Needle length: 10 cm Needle insertion depth: 5 cm Assessment Sensory level: T4 Events: CSF return

## 2020-09-16 NOTE — Progress Notes (Signed)
Orthopedic Tech Progress Note Patient Details:  Kyrianna Barletta Jul 05, 1955 [id number]  Patient ID: Deanna Dorsey, female   DOB: 20-Dec-1955, 65 y.o.   MRN: [id number]  Kennis Carina 09/16/2020, 12:25 PM Cpm applied to left leg in pacu

## 2020-09-16 NOTE — Discharge Instructions (Addendum)
 Frank Aluisio, MD Total Joint Specialist [facility] [address] [place], [place] [address] [phone]  TOTAL KNEE REPLACEMENT POSTOPERATIVE DIRECTIONS    Knee Rehabilitation, Guidelines Following Surgery  Results after knee surgery are often greatly improved when you follow the exercise, range of motion and muscle strengthening exercises prescribed by your doctor. Safety measures are also important to protect the knee from further injury. If any of these exercises cause you to have increased pain or swelling in your knee joint, decrease the amount until you are comfortable again and slowly increase them. If you have problems or questions, call your caregiver or physical therapist for advice.    BLOOD CLOT PREVENTION Take a 10 mg Xarelto once a day for three weeks following surgery. Then take an 81 mg Aspirin once a day for three weeks. Then discontinue Aspirin. You may resume your vitamins/supplements once you have discontinued the Xarelto. Do not take any NSAIDs (Advil, Aleve, Ibuprofen, Meloxicam, etc.) until you have discontinued the Xarelto.    HOME CARE INSTRUCTIONS  Remove items at home which could result in a fall. This includes throw rugs or furniture in walking pathways.  ICE to the affected knee as much as tolerated. Icing helps control swelling. If the swelling is well controlled you will be more comfortable and rehab easier. Continue to use ice on the knee for pain and swelling from surgery. You may notice swelling that will progress down to the foot and ankle. This is normal after surgery. Elevate the leg when you are not up walking on it.    Continue to use the breathing machine which will help keep your temperature down. It is common for your temperature to cycle up and down following surgery, especially at night when you are not up moving around and exerting yourself. The breathing machine keeps your lungs expanded and your temperature  down. Do not place pillow under the operative knee, focus on keeping the knee straight while resting  DIET You may resume your previous home diet once you are discharged from the hospital.  DRESSING / WOUND CARE / SHOWERING Keep your bulky bandage on for 2 days. On the third post-operative day you may remove the Ace bandage and gauze. There is a waterproof adhesive bandage on your skin which will stay in place until your first follow-up appointment. Once you remove this you will not need to place another bandage You may begin showering 3 days following surgery, but do not submerge the incision under water.  ACTIVITY For the first 5 days, the key is rest and control of pain and swelling Do your home exercises twice a day starting on post-operative day 3. On the days you go to physical therapy, just do the home exercises once that day. You should rest, ice and elevate the leg for 50 minutes out of every hour. Get up and walk/stretch for 10 minutes per hour. After 5 days you can increase your activity slowly as tolerated. Walk with your walker as instructed. Use the walker until you are comfortable transitioning to a cane. Walk with the cane in the opposite hand of the operative leg. You may discontinue the cane once you are comfortable and walking steadily. Avoid periods of inactivity such as sitting longer than an hour when not asleep. This helps prevent blood clots.  You may discontinue the knee immobilizer once you are able to perform a straight leg raise while lying down. You may resume a sexual relationship in one   month or when given the OK by your doctor.  You may return to work once you are cleared by your doctor.  Do not drive a car for 6 weeks or until released by your surgeon.  Do not drive while taking narcotics.  TED HOSE STOCKINGS Wear the elastic stockings on both legs for three weeks following surgery during the day. You may remove them at night for sleeping.  WEIGHT  BEARING Weight bearing as tolerated with assist device (walker, cane, etc) as directed, use it as long as suggested by your surgeon or therapist, typically at least 4-6 weeks.  POSTOPERATIVE CONSTIPATION PROTOCOL Constipation - defined medically as fewer than three stools per week and severe constipation as less than one stool per week.  One of the most common issues patients have following surgery is constipation.  Even if you have a regular bowel pattern at home, your normal regimen is likely to be disrupted due to multiple reasons following surgery.  Combination of anesthesia, postoperative narcotics, change in appetite and fluid intake all can affect your bowels.  In order to avoid complications following surgery, here are some recommendations in order to help you during your recovery period.  Colace (docusate) - Pick up an over-the-counter form of Colace or another stool softener and take twice a day as long as you are requiring postoperative pain medications.  Take with a full glass of water daily.  If you experience loose stools or diarrhea, hold the colace until you stool forms back up. If your symptoms do not get better within 1 week or if they get worse, check with your doctor. Dulcolax (bisacodyl) - Pick up over-the-counter and take as directed by the product packaging as needed to assist with the movement of your bowels.  Take with a full glass of water.  Use this product as needed if not relieved by Colace only.  MiraLax (polyethylene glycol) - Pick up over-the-counter to have on hand. MiraLax is a solution that will increase the amount of water in your bowels to assist with bowel movements.  Take as directed and can mix with a glass of water, juice, soda, coffee, or tea. Take if you go more than two days without a movement. Do not use MiraLax more than once per day. Call your doctor if you are still constipated or irregular after using this medication for 7 days in a row.  If you continue  to have problems with postoperative constipation, please contact the office for further assistance and recommendations.  If you experience "the worst abdominal pain ever" or develop nausea or vomiting, please contact the office immediatly for further recommendations for treatment.  ITCHING If you experience itching with your medications, try taking only a single pain pill, or even half a pain pill at a time.  You can also use Benadryl over the counter for itching or also to help with sleep.   MEDICATIONS See your medication summary on the "After Visit Summary" that the nursing staff will review with you prior to discharge.  You may have some home medications which will be placed on hold until you complete the course of blood thinner medication.  It is important for you to complete the blood thinner medication as prescribed by your surgeon.  Continue your approved medications as instructed at time of discharge.  PRECAUTIONS If you experience chest pain or shortness of breath - call 911 immediately for transfer to the hospital emergency department.  If you develop a fever greater that 101   F, purulent drainage from wound, increased redness or drainage from wound, foul odor from the wound/dressing, or calf pain - CONTACT YOUR SURGEON.                                                   FOLLOW-UP APPOINTMENTS Make sure you keep all of your appointments after your operation with your surgeon and caregivers. You should call the office at the above phone number and make an appointment for approximately two weeks after the date of your surgery or on the date instructed by your surgeon outlined in the "After Visit Summary".  RANGE OF MOTION AND STRENGTHENING EXERCISES  Rehabilitation of the knee is important following a knee injury or an operation. After just a few days of immobilization, the muscles of the thigh which control the knee become weakened and shrink (atrophy). Knee exercises are designed to build up  the tone and strength of the thigh muscles and to improve knee motion. Often times heat used for twenty to thirty minutes before working out will loosen up your tissues and help with improving the range of motion but do not use heat for the first two weeks following surgery. These exercises can be done on a training (exercise) mat, on the floor, on a table or on a bed. Use what ever works the best and is most comfortable for you Knee exercises include:  Leg Lifts - While your knee is still immobilized in a splint or cast, you can do straight leg raises. Lift the leg to 60 degrees, hold for 3 sec, and slowly lower the leg. Repeat 10-20 times 2-3 times daily. Perform this exercise against resistance later as your knee gets better.  Quad and Hamstring Sets - Tighten up the muscle on the front of the thigh (Quad) and hold for 5-10 sec. Repeat this 10-20 times hourly. Hamstring sets are done by pushing the foot backward against an object and holding for 5-10 sec. Repeat as with quad sets.  Leg Slides: Lying on your back, slowly slide your foot toward your buttocks, bending your knee up off the floor (only go as far as is comfortable). Then slowly slide your foot back down until your leg is flat on the floor again. Angel Wings: Lying on your back spread your legs to the side as far apart as you can without causing discomfort.  A rehabilitation program following serious knee injuries can speed recovery and prevent re-injury in the future due to weakened muscles. Contact your doctor or a physical therapist for more information on knee rehabilitation.   POST-OPERATIVE OPIOID TAPER INSTRUCTIONS: It is important to wean off of your opioid medication as soon as possible. If you do not need pain medication after your surgery it is ok to stop day one. Opioids include: Codeine, Hydrocodone(Norco, Vicodin), Oxycodone(Percocet, oxycontin) and hydromorphone amongst others.  Long term and even short term use of opiods can  cause: Increased pain response Dependence Constipation Depression Respiratory depression And more.  Withdrawal symptoms can include Flu like symptoms Nausea, vomiting And more Techniques to manage these symptoms Hydrate well Eat regular healthy meals Stay active Use relaxation techniques(deep breathing, meditating, yoga) Do Not substitute Alcohol to help with tapering If you have been on opioids for less than two weeks and do not have pain than it is ok to stop all together.    Plan to wean off of opioids This plan should start within one week post op of your joint replacement. Maintain the same interval or time between taking each dose and first decrease the dose.  Cut the total daily intake of opioids by one tablet each day Next start to increase the time between doses. The last dose that should be eliminated is the evening dose.   IF YOU ARE TRANSFERRED TO A SKILLED REHAB FACILITY If the patient is transferred to a skilled rehab facility following release from the hospital, a list of the current medications will be sent to the facility for the patient to continue.  When discharged from the skilled rehab facility, please have the facility set up the patient's Home Health Physical Therapy prior to being released. Also, the skilled facility will be responsible for providing the patient with their medications at time of release from the facility to include their pain medication, the muscle relaxants, and their blood thinner medication. If the patient is still at the rehab facility at time of the two week follow up appointment, the skilled rehab facility will also need to assist the patient in arranging follow up appointment in our office and any transportation needs.  MAKE SURE YOU:  Understand these instructions.  Get help right away if you are not doing well or get worse.   DENTAL ANTIBIOTICS:  In most cases prophylactic antibiotics for Dental procdeures after total joint surgery are  not necessary.  Exceptions are as follows:  1. History of prior total joint infection  2. Severely immunocompromised (Organ Transplant, cancer chemotherapy, Rheumatoid biologic meds such as Humera)  3. Poorly controlled diabetes (A1C &gt; 8.0, blood glucose over 200)  If you have one of these conditions, contact your surgeon for an antibiotic prescription, prior to your dental procedure.    Pick up stool softner and laxative for home use following surgery while on pain medications. Do not submerge incision under water. Please use good hand washing techniques while changing dressing each day. May shower starting three days after surgery. Please use a clean towel to pat the incision dry following showers. Continue to use ice for pain and swelling after surgery. Do not use any lotions or creams on the incision until instructed by your surgeon.      Information on my medicine - XARELTO (Rivaroxaban)  This medication education was reviewed with me or my healthcare representative as part of my discharge preparation.    Why was Xarelto prescribed for you? Xarelto was prescribed for you to reduce the risk of blood clots forming after orthopedic surgery. The medical term for these abnormal blood clots is venous thromboembolism (VTE).  What do you need to know about xarelto ? Take your Xarelto ONCE DAILY at the same time every day. You may take it either with or without food.  If you have difficulty swallowing the tablet whole, you may crush it and mix in applesauce just prior to taking your dose.  Take Xarelto exactly as prescribed by your doctor and DO NOT stop taking Xarelto without talking to the doctor who prescribed the medication.  Stopping without other VTE prevention medication to take the place of Xarelto may increase your risk of developing a clot.  After discharge, you should have regular check-up appointments with your healthcare provider that is prescribing your  Xarelto.    What do you do if you miss a dose? If you miss a dose, take it as soon as you remember on the same   day then continue your regularly scheduled once daily regimen the next day. Do not take two doses of Xarelto on the same day.   Important Safety Information A possible side effect of Xarelto is bleeding. You should call your healthcare provider right away if you experience any of the following: Bleeding from an injury or your nose that does not stop. Unusual colored urine (red or dark brown) or unusual colored stools (red or black). Unusual bruising for unknown reasons. A serious fall or if you hit your head (even if there is no bleeding).  Some medicines may interact with Xarelto and might increase your risk of bleeding while on Xarelto. To help avoid this, consult your healthcare provider or pharmacist prior to using any new prescription or non-prescription medications, including herbals, vitamins, non-steroidal anti-inflammatory drugs (NSAIDs) and supplements.  This website has more information on Xarelto: www.xarelto.com.   

## 2020-09-16 NOTE — Op Note (Signed)
OPERATIVE REPORT-TOTAL KNEE ARTHROPLASTY   Pre-operative diagnosis- Osteoarthritis  Left knee(s)  Post-operative diagnosis- Osteoarthritis Left knee(s)  Procedure-  Left  Total Knee Arthroplasty  Surgeon- Dione Plover. Wofford Stratton, MD  Assistant- Fenton Foy, PA-C   Anesthesia-   Adductor canal block and spinal  EBL-100 mL   Drains None  Tourniquet time-  Total Tourniquet Time Documented: Thigh (Left) - 36 minutes Total: Thigh (Left) - 36 minutes     Complications- None  Condition-PACU - hemodynamically stable.   Brief Clinical Note  Deanna Dorsey is a 65 y.o. year old female with end stage OA of her left knee with progressively worsening pain and dysfunction. She has constant pain, with activity and at rest and significant functional deficits with difficulties even with ADLs. She has had extensive non-op management including analgesics, injections of cortisone and viscosupplements, and home exercise program, but remains in significant pain with significant dysfunction. Radiographs show bone on bone arthritis medially. She presents now for left Total Knee Arthroplasty.     Procedure in detail---   The patient is brought into the operating room and positioned supine on the operating table. After successful administration of  Adductor canal block and spinal,   a tourniquet is placed high on the  Left thigh(s) and the lower extremity is prepped and draped in the usual sterile fashion. Time out is performed by the operating team and then the  Left lower extremity is wrapped in Esmarch, knee flexed and the tourniquet inflated to 300 mmHg.       A midline incision is made with a ten blade through the subcutaneous tissue to the level of the extensor mechanism. A fresh blade is used to make a medial parapatellar arthrotomy. Soft tissue over the proximal medial tibia is subperiosteally elevated to the joint line with a knife and into the semimembranosus bursa with a Cobb elevator. Soft tissue  over the proximal lateral tibia is elevated with attention being paid to avoiding the patellar tendon on the tibial tubercle. The patella is everted, knee flexed 90 degrees and the ACL and PCL are removed. Findings are bone on bone medial with exposed patellar bone        The drill is used to create a starting hole in the distal femur and the canal is thoroughly irrigated with sterile saline to remove the fatty contents. The 5 degree Left  valgus alignment guide is placed into the femoral canal and the distal femoral cutting block is pinned to remove 9 mm off the distal femur. Resection is made with an oscillating saw.      The tibia is subluxed forward and the menisci are removed. The extramedullary alignment guide is placed referencing proximally at the medial aspect of the tibial tubercle and distally along the second metatarsal axis and tibial crest. The block is pinned to remove 47mm off the more deficient medial  side. Resection is made with an oscillating saw. Size 3is the most appropriate size for the tibia and the proximal tibia is prepared with the modular drill and keel punch for that size.      The femoral sizing guide is placed and size 4 is most appropriate. Rotation is marked off the epicondylar axis and confirmed by creating a rectangular flexion gap at 90 degrees. The size 4 cutting block is pinned in this rotation and the anterior, posterior and chamfer cuts are made with the oscillating saw. The intercondylar block is then placed and that cut is made.  Trial size 3 tibial component, trial size 4 narrow posterior stabilized femur and a 7  mm posterior stabilized rotating platform insert trial is placed. Full extension is achieved with excellent varus/valgus and anterior/posterior balance throughout full range of motion. The patella is everted and thickness measured to be 23  mm. Free hand resection is taken to 13 mm, a 35 template is placed, lug holes are drilled, trial patella is placed,  and it tracks normally. Osteophytes are removed off the posterior femur with the trial in place. All trials are removed and the cut bone surfaces prepared with pulsatile lavage. Cement is mixed and once ready for implantation, the size 3 tibial implant, size  4 posterior stabilized femoral component, and the size 35 patella are cemented in place and the patella is held with the clamp. The trial insert is placed and the knee held in full extension. The Exparel (20 ml mixed with 60 ml saline) is injected into the extensor mechanism, posterior capsule, medial and lateral gutters and subcutaneous tissues.  All extruded cement is removed and once the cement is hard the permanent 7 mm posterior stabilized rotating platform insert is placed into the tibial tray.      The wound is copiously irrigated with saline solution and the extensor mechanism closed with # 0 Stratofix suture. The tourniquet is released for a total tourniquet time of 36  minutes. Flexion against gravity is 140 degrees and the patella tracks normally. Subcutaneous tissue is closed with 2.0 vicryl and subcuticular with running 4.0 Monocryl. The incision is cleaned and dried and steri-strips and a bulky sterile dressing are applied. The limb is placed into a knee immobilizer and the patient is awakened and transported to recovery in stable condition.      Please note that a surgical assistant was a medical necessity for this procedure in order to perform it in a safe and expeditious manner. Surgical assistant was necessary to retract the ligaments and vital neurovascular structures to prevent injury to them and also necessary for proper positioning of the limb to allow for anatomic placement of the prosthesis.   Dione Plover Cerrone Debold, MD    09/16/2020, 11:29 AM

## 2020-09-16 NOTE — Anesthesia Postprocedure Evaluation (Signed)
Anesthesia Post Note  Patient: Deanna Dorsey  Procedure(s) Performed: LEFT TOTAL KNEE ARTHROPLASTY (Left: Knee)     Patient location during evaluation: PACU Anesthesia Type: Spinal Level of consciousness: awake Pain management: pain level controlled Vital Signs Assessment: post-procedure vital signs reviewed and stable Respiratory status: spontaneous breathing Cardiovascular status: stable Postop Assessment: no headache, no backache, spinal receding, patient able to bend at knees and no apparent nausea or vomiting Anesthetic complications: no   No notable events documented.  Last Vitals:  Vitals:   09/16/20 1230 09/16/20 1245  BP: 124/64 (!) 144/69  Pulse: 61 72  Resp: 11 15  Temp:    SpO2: 97% 95%    Last Pain:  Vitals:   09/16/20 1245  TempSrc:   PainSc: [name]

## 2020-09-16 NOTE — Anesthesia Procedure Notes (Addendum)
Anesthesia Regional Block: Adductor canal block   Pre-Anesthetic Checklist: , timeout performed,  Correct Patient, Correct Site, Correct Laterality,  Correct Procedure, Correct Position, site marked,  Risks and benefits discussed,  Surgical consent,  Pre-op evaluation,  At surgeon's request and post-op pain management  Laterality: Lower and Left  Prep: chloraprep       Needles:  Injection technique: Single-shot  Needle Type: Echogenic Stimulator Needle     Needle Length: 9cm  Needle Gauge: 20   Needle insertion depth: 3 cm   Additional Needles:   Procedures:,,,, ultrasound used (permanent image in chart),,    Narrative:  Start time: 09/16/2020 9:40 AM End time: 09/16/2020 9:50 AM Injection made incrementally with aspirations every 5 mL.  Performed by: Personally  Anesthesiologist: Lyn Hollingshead, MD

## 2020-09-16 NOTE — Interval H&P Note (Signed)
History and Physical Interval Note:  09/16/2020 8:15 AM  Deanna Dorsey  has presented today for surgery, with the diagnosis of LEFT KNEE OSTEOARTHRITIS.  The various methods of treatment have been discussed with the patient and family. After consideration of risks, benefits and other options for treatment, the patient has consented to  Procedure(s): LEFT TOTAL KNEE ARTHROPLASTY (Left) as a surgical intervention.  The patient's history has been reviewed, patient examined, no change in status, stable for surgery.  I have reviewed the patient's chart and labs.  Questions were answered to the patient's satisfaction.     Pilar Plate Markis Langland

## 2020-09-16 NOTE — Evaluation (Signed)
Physical Therapy Evaluation Patient Details Name: Deanna Dorsey MRN: [id number] DOB: 08/27/1955 Today's Date: 09/16/2020   History of Present Illness  65 yo female s/p L TKA 09/16/20  Clinical Impression  On eval POD 0, pt required Min A for mobility. She walked ~25 feet with a RW. Pt denied pain. She did report dizziness and drowsiness-ambulation limited for these reasons. Will plan to follow and progress activity as tolerated.     Follow Up Recommendations Follow surgeon's recommendation for DC plan and follow-up therapies    Equipment Recommendations  3in1 (PT)    Recommendations for Other Services       Precautions / Restrictions Precautions Precautions: Fall;Knee Required Braces or Orthoses: Knee Immobilizer - Left Knee Immobilizer - Left: Discontinue once straight leg raise with < 10 degree lag Restrictions Weight Bearing Restrictions: No Other Position/Activity Restrictions: WBAT      Mobility  Bed Mobility Overal bed mobility: Needs Assistance Bed Mobility: Supine to Sit     Supine to sit: Min assist;HOB elevated     General bed mobility comments: Assist for L LE. Cues for safety, technique. Increased time.    Transfers Overall transfer level: Needs assistance Equipment used: Rolling walker (2 wheeled) Transfers: Sit to/from Stand Sit to Stand: Min assist         General transfer comment: Assist to rise, stabilize, control descent. Cues for safety, technique, hand/LE placement  Ambulation/Gait Ambulation/Gait assistance: Min assist Gait Distance (Feet): 25 Feet Assistive device: Rolling walker (2 wheeled) Gait Pattern/deviations: Step-to pattern     General Gait Details: Cues for safety, technique, sequence, posture. Assist to stabilize througout distance. Distance limited 2* drowsiness, lightheadedness. Followed with recliner for safety  Stairs            Wheelchair Mobility    Modified Rankin (Stroke Patients Only)       Balance  Overall balance assessment: Needs assistance         Standing balance support: Bilateral upper extremity supported Standing balance-Leahy Scale: Poor                               Pertinent Vitals/Pain Pain Assessment: No/denies pain    Home Living Family/patient expects to be discharged to:: Private residence Living Arrangements: Alone Available Help at Discharge: Family (grandaughter helping) Type of Home: House Home Access: Stairs to enter;Ramped entrance Entrance Stairs-Rails: None Entrance Stairs-Number of Steps: 2 Home Layout: One level Home Equipment: Environmental consultant - 2 wheels      Prior Function Level of Independence: Independent               Hand Dominance        Extremity/Trunk Assessment   Upper Extremity Assessment Upper Extremity Assessment: Overall WFL for tasks assessed    Lower Extremity Assessment Lower Extremity Assessment: Generalized weakness (post op weakness)    Cervical / Trunk Assessment Cervical / Trunk Assessment: Normal  Communication   Communication: No difficulties  Cognition Arousal/Alertness: Awake/alert Behavior During Therapy: WFL for tasks assessed/performed Overall Cognitive Status: Within Functional Limits for tasks assessed                                 General Comments: drowsy 2* meds      General Comments      Exercises     Assessment/Plan    PT Assessment Patient needs continued PT services  PT Problem List Decreased strength;Decreased mobility;Decreased range of motion;Decreased activity tolerance;Decreased balance;Decreased knowledge of use of DME;Pain       PT Treatment Interventions DME instruction;Balance training;Therapeutic exercise;Gait training;Stair training;Functional mobility training;Therapeutic activities;Patient/family education    PT Goals (Current goals can be found in the Care Plan section)  Acute Rehab PT Goals 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 PT Goal Formulation: With patient Time For Goal Achievement: 09/30/20 Potential to Achieve Goals: Good    Frequency 7X/week   Barriers to discharge        Co-evaluation               AM-PAC PT "6 Clicks" Mobility  Outcome Measure Help needed turning from your back to your side while in a flat bed without using bedrails?: A Little Help needed moving from lying on your back to sitting on the side of a flat bed without using bedrails?: A Little Help needed moving to and from a bed to a chair (including a wheelchair)?: A Little Help needed standing up from a chair using your arms (e.g., wheelchair or bedside chair)?: A Little Help needed to walk in hospital room?: A Little Help needed climbing 3-5 steps with a railing? : A Little 6 Click Score: 18    End of Session Equipment Utilized During Treatment: Gait belt;Left knee immobilizer Activity Tolerance: Patient tolerated treatment well Patient left: in bed;with call bell/phone within reach;with bed alarm set   PT Visit Diagnosis: Other abnormalities of gait and mobility (R26.89);Pain Pain - Right/Left: Left Pain - part of body: Knee    Time: 0923-3007 PT Time Calculation (min) (ACUTE ONLY): 15 min   Charges:   PT Evaluation $PT Eval Low Complexity: M[place], PT Acute Rehabilitation  Office: [phone] Pager: [phone]

## 2020-09-16 NOTE — Progress Notes (Signed)
Assisted Dr. Lyn Hollingshead with Left Knee Adductor canal block. Side rails up, monitors on throughout procedure. See vital signs in flow sheet. Tolerated Procedure well.

## 2020-09-16 NOTE — Transfer of Care (Signed)
Immediate Anesthesia Transfer of Care Note  Patient: Deanna Dorsey  Procedure(s) Performed: LEFT TOTAL KNEE ARTHROPLASTY (Left: Knee)  Patient Location: PACU  Anesthesia Type:Spinal  Level of Consciousness: awake, alert  and oriented  Airway & Oxygen Therapy: Patient Spontanous Breathing and Patient connected to face mask oxygen  Post-op Assessment: Report given to RN and Post -op Vital signs reviewed and stable  Post vital signs: Reviewed and stable  Last Vitals:  Vitals Value Taken Time  BP 129/68 09/16/20 1203  Temp    Pulse 69 09/16/20 1204  Resp 16 09/16/20 1204  SpO2 100 % 09/16/20 1204  Vitals shown include unvalidated device data.  Last Pain:  Vitals:   09/16/20 0838  TempSrc:   PainSc: 4          Complications: No notable events documented.

## 2020-09-16 NOTE — Anesthesia Preprocedure Evaluation (Signed)
Anesthesia Evaluation  Patient identified by MRN, date of birth, ID band Patient awake    History of Anesthesia Complications (+) PONV, Family history of anesthesia reaction and history of anesthetic complications  Airway Mallampati: I       Dental no notable dental hx.    Pulmonary    Pulmonary exam normal        Cardiovascular negative cardio ROS Normal cardiovascular exam     Neuro/Psych negative neurological ROS  negative psych ROS   GI/Hepatic Neg liver ROS,   Endo/Other    Renal/GU negative Renal ROS  negative genitourinary   Musculoskeletal   Abdominal Normal abdominal exam  (+)   Peds  Hematology negative hematology ROS (+)   Anesthesia Other Findings   Reproductive/Obstetrics                             Anesthesia Physical Anesthesia Plan  ASA: 2  Anesthesia Plan: Spinal   Post-op Pain Management:  Regional for Post-op pain   Induction:   PONV Risk Score and Plan: 3 and Ondansetron, Dexamethasone and Midazolam  Airway Management Planned: Natural Airway and Simple Face Mask  Additional Equipment: None  Intra-op Plan:   Post-operative Plan:   Informed Consent: I have reviewed the patients History and Physical, chart, labs and discussed the procedure including the risks, benefits and alternatives for the proposed anesthesia with the patient or authorized representative who has indicated his/her understanding and acceptance.       Plan Discussed with:   Anesthesia Plan Comments:         Anesthesia Quick Evaluation

## 2020-09-17 ENCOUNTER — Other Ambulatory Visit (HOSPITAL_COMMUNITY): Payer: Self-pay

## 2020-09-17 ENCOUNTER — Encounter (HOSPITAL_COMMUNITY): Payer: Self-pay | Admitting: Orthopedic Surgery

## 2020-09-17 DIAGNOSIS — Z888 Allergy status to other drugs, medicaments and biological substances status: Secondary | ICD-10-CM | POA: Diagnosis not present

## 2020-09-17 DIAGNOSIS — Z20822 Contact with and (suspected) exposure to covid-19: Secondary | ICD-10-CM | POA: Diagnosis present

## 2020-09-17 DIAGNOSIS — Z79899 Other long term (current) drug therapy: Secondary | ICD-10-CM | POA: Diagnosis not present

## 2020-09-17 DIAGNOSIS — Z96652 Presence of left artificial knee joint: Secondary | ICD-10-CM | POA: Diagnosis present

## 2020-09-17 DIAGNOSIS — M1712 Unilateral primary osteoarthritis, left knee: Secondary | ICD-10-CM | POA: Diagnosis present

## 2020-09-17 LAB — BASIC METABOLIC PANEL
Anion gap: 7 (ref 5–15)
BUN: 9 mg/dL (ref 8–23)
CO2: 25 mmol/L (ref 22–32)
Calcium: 8.4 mg/dL — ABNORMAL LOW (ref 8.9–10.3)
Chloride: 104 mmol/L (ref 98–111)
Creatinine, Ser: 0.65 mg/dL (ref 0.44–1.00)
GFR, Estimated: >60 mL/min (ref >60)
Glucose, Bld: 141 mg/dL — ABNORMAL HIGH (ref 70–99)
Potassium: 4 mmol/L (ref 3.5–5.1)
Sodium: 136 mmol/L (ref 135–145)

## 2020-09-17 LAB — CBC
HCT: 33.9 % — ABNORMAL LOW (ref 36.0–46.0)
Hemoglobin: 11.3 g/dL — ABNORMAL LOW (ref 12.0–15.0)
MCH: 31.8 pg (ref 26.0–34.0)
MCHC: 33.3 g/dL (ref 30.0–36.0)
MCV: 95.5 fL (ref 80.0–100.0)
Platelets: 240 10*3/uL (ref 150–400)
RBC: 3.55 MIL/uL — ABNORMAL LOW (ref 3.87–5.11)
RDW: 13.6 % (ref 11.5–15.5)
WBC: 13.4 10*3/uL — ABNORMAL HIGH (ref 4.0–10.5)
nRBC: 0 % (ref 0.0–0.2)

## 2020-09-17 MED ORDER — TRAMADOL HCL 50 MG PO TABS
50.0000 mg | ORAL_TABLET | Freq: Four times a day (QID) | ORAL | 0 refills | Status: DC | PRN
  Filled 2020-09-17: qty 40, 5d supply, fill #0

## 2020-09-17 MED ORDER — RIVAROXABAN 10 MG PO TABS
10.0000 mg | ORAL_TABLET | Freq: Every day | ORAL | 0 refills | Status: DC
  Filled 2020-09-17: qty 20, 20d supply, fill #0

## 2020-09-17 MED ORDER — SODIUM CHLORIDE 0.9 % IV BOLUS
250.0000 mL | Freq: Once | INTRAVENOUS | Status: AC
  Administered 2020-09-17: 250 mL via INTRAVENOUS

## 2020-09-17 MED ORDER — PROMETHAZINE HCL 12.5 MG PO TABS
12.5000 mg | ORAL_TABLET | Freq: Four times a day (QID) | ORAL | 0 refills | Status: DC | PRN
  Filled 2020-09-17: qty 30, 8d supply, fill #0

## 2020-09-17 MED ORDER — GABAPENTIN 300 MG PO CAPS
ORAL_CAPSULE | ORAL | 0 refills | Status: DC
  Filled 2020-09-17: qty 84, 42d supply, fill #0

## 2020-09-17 MED ORDER — METHOCARBAMOL 500 MG PO TABS
500.0000 mg | ORAL_TABLET | Freq: Four times a day (QID) | ORAL | 0 refills | Status: DC | PRN
  Filled 2020-09-17: qty 40, 10d supply, fill #0

## 2020-09-17 MED ORDER — PROMETHAZINE HCL 25 MG PO TABS: 12.5000 mg | ORAL_TABLET | Freq: Four times a day (QID) | ORAL | Status: DC | PRN

## 2020-09-17 MED ORDER — HYDROCODONE-ACETAMINOPHEN 5-325 MG PO TABS
1.0000 | ORAL_TABLET | Freq: Four times a day (QID) | ORAL | 0 refills | Status: DC | PRN
  Filled 2020-09-17: qty 42, 5d supply, fill #0

## 2020-09-17 NOTE — Progress Notes (Signed)
Physical Therapy Treatment Patient Details Name: Deanna Dorsey MRN: [id number] DOB: 09-15-1955 Today's Date: 09/17/2020    History of Present Illness 65 yo female s/p L TKA 09/16/20    PT Comments    Patient  reports nausea improved. Has been premedicated for pain. Tolerated  Ambulating x 90'. Plans for DC  tomorrow.  Follow Up Recommendations  Follow surgeon's recommendation for DC plan and follow-up therapies     Equipment Recommendations  3in1 (PT)    Recommendations for Other Services       Precautions / Restrictions Precautions Precautions: Fall;Knee Knee Immobilizer - Left: Discontinue once straight leg raise with < 10 degree lag    Mobility  Bed Mobility   Bed Mobility: Supine to Sit;Sit to Supine     Supine to sit: Supervision Sit to supine: Supervision   General bed mobility comments: used right leg to move left leg off/onto bed    Transfers Overall transfer level: Needs assistance Equipment used: Rolling walker (2 wheeled) Transfers: Sit to/from Stand Sit to Stand: Min guard         General transfer comment: steady assist, no KI  needed  Ambulation/Gait Ambulation/Gait assistance: Min guard Gait Distance (Feet): 15 Feet (then 90) Assistive device: Rolling walker (2 wheeled) Gait Pattern/deviations: Step-through pattern Gait velocity: decr   General Gait Details: improved steady gait sequence   Stairs             Wheelchair Mobility    Modified Rankin (Stroke Patients Only)       Balance Overall balance assessment: Needs assistance Sitting-balance support: No upper extremity supported;Feet supported Sitting balance-Leahy Scale: Good     Standing balance support: Bilateral upper extremity supported;During functional activity Standing balance-Leahy Scale: Poor                              Cognition Arousal/Alertness: Awake/alert                                            Exercises   General  Comments        Pertinent Vitals/Pain Pain Assessment: 0-10 Pain Score: 6  Pain Location: left knee Pain Descriptors / Indicators: Discomfort;Aching Pain Intervention(s): Monitored during session;Premedicated before session    Home Living                      Prior Function            PT Goals (current goals can now be found in the care plan section) Progress towards PT goals: Progressing toward goals    Frequency    7X/week      PT Plan Current plan remains appropriate    Co-evaluation              AM-PAC PT "6 Clicks" Mobility   Outcome Measure  Help needed turning from your back to your side while in a flat bed without using bedrails?: A Little Help needed moving from lying on your back to sitting on the side of a flat bed without using bedrails?: A Little Help needed moving to and from a bed to a chair (including a wheelchair)?: A Little Help needed standing up from a chair using your arms (e.g., wheelchair or bedside chair)?: A Little Help needed to walk in hospital room?: A Little Help  needed climbing 3-5 steps with a railing? : A Little 6 Click Score: 18    End of Session Equipment Utilized During Treatment: Gait belt Activity Tolerance: Patient tolerated treatment well Patient left: in bed;with call bell/phone within reach;with bed alarm set Nurse Communication: Mobility status PT Visit Diagnosis: Other abnormalities of gait and mobility (R26.89);Pain Pain - Right/Left: Left Pain - part of body: Knee     Time: 5430-1484 PT Time Calculation (min) (ACUTE ONLY): 30 min  Charges:  $Gait Training: 8-22 mins  $Self Care/Home Management: [place] Pager [phone] Office [phone]    Claretha Cooper 09/17/2020, 1:51 PM

## 2020-09-17 NOTE — Progress Notes (Signed)
Physical Therapy Treatment Patient Details Name: Deanna Dorsey MRN: [id number] DOB: [birth year] Today's Date: 09/17/2020    History of Present Illness 65 yo female s/p L TKA 09/16/20    PT Comments    The patient has been medicated for pain. Patient ambulated x 60'.Tolerated there ex. Patient  has  nausea off and on which is limiting . Continue PT in PM.  Follow Up Recommendations  Follow surgeon's recommendation for DC plan and follow-up therapies     Equipment Recommendations  3in1 (PT)    Recommendations for Other Services       Precautions / Restrictions Precautions Precautions: Fall;Knee Knee Immobilizer - Left: Discontinue once straight leg raise with < 10 degree lag    Mobility  Bed Mobility   Bed Mobility: Supine to Sit;Sit to Supine     Supine to sit: Min guard Sit to supine: Min guard   General bed mobility comments: used right leg to move left leg off/onto bed    Transfers Overall transfer level: Needs assistance Equipment used: Rolling walker (2 wheeled) Transfers: Sit to/from Stand Sit to Stand: Min assist         General transfer comment: steady assist, no KI  needed  Ambulation/Gait Ambulation/Gait assistance: Min Web designer (Feet): 15 Feet (then 60) Assistive device: Rolling walker (2 wheeled) Gait Pattern/deviations: Step-to pattern;Step-through pattern;Antalgic Gait velocity: decr   General Gait Details: Cues for safety, technique, sequence, posture. Assist to stabilize througout distance.   Stairs             Wheelchair Mobility    Modified Rankin (Stroke Patients Only)       Balance Overall balance assessment: Needs assistance Sitting-balance support: No upper extremity supported;Feet supported Sitting balance-Leahy Scale: Good     Standing balance support: Bilateral upper extremity supported;During functional activity Standing balance-Leahy Scale: Poor                              Cognition  Arousal/Alertness: Awake/alert                                            Exercises Total Joint Exercises Ankle Circles/Pumps: AROM;Both;10 reps Quad Sets: AROM;Both;10 reps Heel Slides: AAROM;Left;10 reps Hip ABduction/ADduction: AAROM;Left;10 reps Straight Leg Raises: AAROM;Left;10 reps Goniometric ROM: 5-60 knee flex Left    General Comments        Pertinent Vitals/Pain Pain Assessment: 0-10 Pain Score: 6  Pain Location: left knee Pain Descriptors / Indicators: Discomfort;Aching Pain Intervention(s): Monitored during session;Premedicated before session;Ice applied    Home Living                      Prior Function            PT Goals (current goals can now be found in the care plan section) Progress towards PT goals: Progressing toward goals    Frequency    7X/week      PT Plan Current plan remains appropriate    Co-evaluation              AM-PAC PT "6 Clicks" Mobility   Outcome Measure  Help needed turning from your back to your side while in a flat bed without using bedrails?: A Little Help needed moving from lying on your back to sitting on the side of  a flat bed without using bedrails?: A Little Help needed moving to and from a bed to a chair (including a wheelchair)?: A Little Help needed standing up from a chair using your arms (e.g., wheelchair or bedside chair)?: A Little Help needed to walk in hospital room?: A Little Help needed climbing 3-5 steps with a railing? : A Little 6 Click Score: 18    End of Session Equipment Utilized During Treatment: Gait belt Activity Tolerance: Patient tolerated treatment well Patient left: in bed;with call bell/phone within reach;with bed alarm set Nurse Communication: Mobility status PT Visit Diagnosis: Other abnormalities of gait and mobility (R26.89);Pain Pain - Right/Left: Left Pain - part of body: Knee     Time: 9234-1443 PT Time Calculation (min) (ACUTE ONLY): 34  min  Charges:  $Gait Training: 8-22 mins $Therapeutic Exercise: 8-22 mins                    Deanna Dorsey PT Acute Rehabilitation Services Pager [phone] Office [phone]    Claretha Cooper 09/17/2020, 1:46 PM

## 2020-09-17 NOTE — Progress Notes (Signed)
Subjective: 1 Day Post-Op Procedure(s) (LRB): LEFT TOTAL KNEE ARTHROPLASTY (Left) Patient reports pain as mild.   Patient seen in rounds by Dr. Wynelle Link. Patient had issues with pain last night, feeling better this AM. Denies chest pain, SOB, or calf pain. Foley catheter removed this AM. We will continue therapy today, ambulated 25' yesterday.  Objective: Vital signs in last 24 hours: Temp:  [97.3 F (36.3 C)-98.4 F (36.9 C)] 97.8 F (36.6 C) (07/07 0601) Pulse Rate:  [60-89] 60 (07/07 0601) Resp:  [10-19] 19 (07/07 0601) BP: (106-160)/(57-98) 112/58 (07/07 0601) SpO2:  [93 %-100 %] 99 % (07/07 0601) Weight:  [69.9 kg] 69.9 kg (07/06 1400)  Intake/Output from previous day:  Intake/Output Summary (Last 24 hours) at 09/17/2020 0742 Last data filed at 09/17/2020 1610 Gross per 24 hour  Intake 3888.84 ml  Output 2050 ml  Net 1838.84 ml     Intake/Output this shift: No intake/output data recorded.  Labs: Recent Labs    09/17/20 0321  HGB 11.3*   Recent Labs    09/17/20 0321  WBC 13.4*  RBC 3.55*  HCT 33.9*  PLT 240   Recent Labs    09/17/20 0321  NA 136  K 4.0  CL 104  CO2 25  BUN 9  CREATININE 0.65  GLUCOSE 141*  CALCIUM 8.4*   No results for input(s): LABPT, INR in the last 72 hours.  Exam: General - Patient is Alert and Oriented Extremity - Neurologically intact Neurovascular intact Sensation intact distally Dorsiflexion/Plantar flexion intact Dressing - dressing C/D/I Motor Function - intact, moving foot and toes well on exam.   Past Medical History:  Diagnosis Date   Arthritis    Asthma 07/17/2020   vaccine triggered,  allergy triggered asthma   Family history of adverse reaction to anesthesia    sister slow to wake up   History of kidney stones    1982   PONV (postoperative nausea and vomiting)    Sleep apnea 2003   had surgical fix    Assessment/Plan: 1 Day Post-Op Procedure(s) (LRB): LEFT TOTAL KNEE ARTHROPLASTY  (Left) Principal Problem:   OA (osteoarthritis) of knee Active Problems:   Status post revision of total knee replacement, left  Estimated body mass index is 27.3 kg/m as calculated from the following:   Height as of this encounter: 5\' 3"  (1.6 m).   Weight as of this encounter: 69.9 kg. Advance diet Up with therapy D/C IV fluids   Patient's anticipated LOS is less than 2 midnights, meeting these requirements: - Younger than 33 - Lives within 1 hour of care - Has a competent adult at home to recover with post-op recover - NO history of  - Chronic pain requiring opiods  - Diabetes  - Coronary Artery Disease  - Heart failure  - Heart attack  - Stroke  - DVT/VTE  - Cardiac arrhythmia  - Respiratory Failure/COPD  - Renal failure  - Anemia  - Advanced Liver disease  DVT Prophylaxis - Xarelto Weight bearing as tolerated. Continue therapy.  BP soft this AM, 250 mL bolus ordered. Issues with nausea and pain medications historically, sending home with phenergan PO script.  Plan is to go Home after hospital stay. Plan for discharge later today if progresses with therapy and meeting her goals. Scheduled for OPPT at [place] in the office in 2 weeks  The PDMP database was reviewed today prior to any opioid medications being prescribed to this patient.  Theresa Duty, PA-C  Orthopedic Surgery [phone] 09/17/2020, 7:42 AM

## 2020-09-17 NOTE — TOC Transition Note (Signed)
Transition of Care [facility]) - CM/SW Discharge Note   Patient Details  Name: Deanna Dorsey MRN: [id number] Date of Birth: 03-Oct-1955  [facility] CM/SW Contact:  Lennart Pall, LCSW Phone Number: 09/17/2020, 10:56 AM   Clinical Narrative:    Met briefly with pt and confirming receipt of DME via [place].  Plan for OPPT at [facility].  No further TOC needs.   Final next level of care: OP Rehab Barriers to Discharge: No Barriers Identified   Patient Goals and CMS Choice Patient states their goals for this hospitalization and ongoing recovery are:: return home      Discharge Placement                       Discharge Plan and Services                DME Arranged: 3-N-1 DME Agency: [place]                  Social Determinants of Health (SDOH) Interventions     Readmission Risk Interventions No flowsheet data found.

## 2020-09-18 LAB — BASIC METABOLIC PANEL
Anion gap: 6 (ref 5–15)
BUN: 11 mg/dL (ref 8–23)
CO2: 27 mmol/L (ref 22–32)
Calcium: 8.6 mg/dL — ABNORMAL LOW (ref 8.9–10.3)
Chloride: 106 mmol/L (ref 98–111)
Creatinine, Ser: 0.67 mg/dL (ref 0.44–1.00)
GFR, Estimated: >60 mL/min (ref >60)
Glucose, Bld: 112 mg/dL — ABNORMAL HIGH (ref 70–99)
Potassium: 4 mmol/L (ref 3.5–5.1)
Sodium: 139 mmol/L (ref 135–145)

## 2020-09-18 LAB — CBC
HCT: 31.5 % — ABNORMAL LOW (ref 36.0–46.0)
Hemoglobin: 10.6 g/dL — ABNORMAL LOW (ref 12.0–15.0)
MCH: 32.6 pg (ref 26.0–34.0)
MCHC: 33.7 g/dL (ref 30.0–36.0)
MCV: 96.9 fL (ref 80.0–100.0)
Platelets: 226 10*3/uL (ref 150–400)
RBC: 3.25 MIL/uL — ABNORMAL LOW (ref 3.87–5.11)
RDW: 13.9 % (ref 11.5–15.5)
WBC: 11.1 10*3/uL — ABNORMAL HIGH (ref 4.0–10.5)
nRBC: 0 % (ref 0.0–0.2)

## 2020-09-18 NOTE — Plan of Care (Signed)

## 2020-09-18 NOTE — Progress Notes (Signed)
   Subjective: 2 Days Post-Op Procedure(s) (LRB): LEFT TOTAL KNEE ARTHROPLASTY (Left) Patient reports pain as mild.   Patient seen in rounds by Dr. Wynelle Link. Patient is well, and has had no acute complaints or problems. Denies SOB, chest pain, or calf pain. No acute overnight events. Ambulated 60-90 feet yesterday.  Plan is to go Home after hospital stay.  Objective: Vital signs in last 24 hours: Temp:  [98.2 F (36.8 C)-99.6 F (37.6 C)] 98.2 F (36.8 C) (07/08 0526) Pulse Rate:  [74-78] 74 (07/08 0526) Resp:  [16-18] 16 (07/08 0526) BP: (126-146)/(58-75) 146/75 (07/08 0526) SpO2:  [91 %-98 %] 98 % (07/08 0738)  Intake/Output from previous day:  Intake/Output Summary (Last 24 hours) at 09/18/2020 0820 Last data filed at 09/18/2020 0600 Gross per 24 hour  Intake 694.93 ml  Output 300 ml  Net 394.93 ml    Intake/Output this shift: No intake/output data recorded.  Labs: Recent Labs    09/17/20 0321 09/18/20 0323  HGB 11.3* 10.6*   Recent Labs    09/17/20 0321 09/18/20 0323  WBC 13.4* 11.1*  RBC 3.55* 3.25*  HCT 33.9* 31.5*  PLT 240 226   Recent Labs    09/17/20 0321 09/18/20 0323  NA 136 139  K 4.0 4.0  CL 104 106  CO2 25 27  BUN 9 11  CREATININE 0.65 0.67  GLUCOSE 141* 112*  CALCIUM 8.4* 8.6*   No results for input(s): LABPT, INR in the last 72 hours.  Exam: General - Patient is Alert and Oriented Extremity - Neurologically intact Neurovascular intact Intact pulses distally Dorsiflexion/Plantar flexion intact Dressing/Incision - clean, dry, no drainage Motor Function - intact, moving foot and toes well on exam.   Past Medical History:  Diagnosis Date   Arthritis    Asthma 07/17/2020   vaccine triggered,  allergy triggered asthma   Family history of adverse reaction to anesthesia    sister slow to wake up   History of kidney stones    1982   PONV (postoperative nausea and vomiting)    Sleep apnea 2003   had surgical fix     Assessment/Plan: 2 Days Post-Op Procedure(s) (LRB): LEFT TOTAL KNEE ARTHROPLASTY (Left) Principal Problem:   OA (osteoarthritis) of knee Active Problems:   Status post revision of total knee replacement, left  Estimated body mass index is 27.3 kg/m as calculated from the following:   Height as of this encounter: 5\' 3"  (1.6 m).   Weight as of this encounter: 69.9 kg. Advance diet  DVT Prophylaxis - Xarelto and TED hose Weight-bearing as tolerated  Plan for two sessions with PT today, and if meeting goals, will plan for discharge this afternoon.   Sending Phenergan PO prescription in for patient to help with ongoing postoperative nausea, which has stabilized.   Patient to follow up in two weeks with Dr. Wynelle Link in clinic.   The PDMP database was reviewed today prior to any opioid medications being prescribed to this patient.Fenton Foy, MBA, PA-C Orthopedic Surgery 09/18/2020, 8:20 AM

## 2020-09-18 NOTE — Progress Notes (Signed)
Physical Therapy Treatment Patient Details Name: Deanna Dorsey MRN: [id number] DOB: March 08, 1956 Today's Date: 09/18/2020    History of Present Illness 65 yo female s/p L TKA 09/16/20    PT Comments     Patient reporting increased pain in left thigh and knee, decreased WB tolerance. And distance. Patient plans Dc today. Instructed patient to take Morris  home incase she has more issues with pain and knee buckling.  Follow Up Recommendations  Follow surgeon's recommendation for DC plan and follow-up therapies     Equipment Recommendations     none  Recommendations for Other Services       Precautions / Restrictions Precautions Precautions: Fall;Knee    Mobility  Bed Mobility   Bed Mobility: Supine to Sit;Sit to Supine     Supine to sit: Min assist Sit to supine: Supervision   General bed mobility comments: assist with left leg, could not scoop it with right, used belt to lift back onto bed.    Transfers Overall transfer level: Needs assistance Equipment used: Rolling walker (2 wheeled) Transfers: Sit to/from Stand Sit to Stand: Min guard            Ambulation/Gait Ambulation/Gait assistance: Min guard Gait Distance (Feet): 20 Feet (x 2) Assistive device: Rolling walker (2 wheeled) Gait Pattern/deviations: Step-through pattern Gait velocity: decr   General Gait Details: improved steady gait sequence   Stairs             Wheelchair Mobility    Modified Rankin (Stroke Patients Only)       Balance                                            Cognition Arousal/Alertness: Awake/alert                                            Exercises      General Comments        Pertinent Vitals/Pain Pain Score: 8  Pain Location: left knee Pain Descriptors / Indicators: Discomfort;Aching Pain Intervention(s): Monitored during session;Limited activity within patient's tolerance;Patient requesting pain meds-RN notified;RN  gave pain meds during session;Ice applied    Home Living                      Prior Function            PT Goals (current goals can now be found in the care plan section) Progress towards PT goals: Progressing toward goals    Frequency    7X/week      PT Plan Current plan remains appropriate    Co-evaluation              AM-PAC PT "6 Clicks" Mobility   Outcome Measure  Help needed turning from your back to your side while in a flat bed without using bedrails?: A Little Help needed moving from lying on your back to sitting on the side of a flat bed without using bedrails?: A Little Help needed moving to and from a bed to a chair (including a wheelchair)?: A Little Help needed standing up from a chair using your arms (e.g., wheelchair or bedside chair)?: A Little Help needed to walk in hospital room?: A Little Help needed climbing 3-5  steps with a railing? : A Little 6 Click Score: 18    End of Session   Activity Tolerance: Patient limited by pain Patient left: in bed;with call bell/phone within reach;with bed alarm set Nurse Communication: Mobility status PT Visit Diagnosis: Other abnormalities of gait and mobility (R26.89);Pain Pain - Right/Left: Left Pain - part of body: Knee     Time: 1140-1200 PT Time Calculation (min) (ACUTE ONLY): 20 min  Charges:  $Gait Training: 8-22 mins                     Ebony Pager [phone] Office [phone]    Deanna Dorsey 09/18/2020, 1:16 PM

## 2020-09-21 NOTE — Discharge Summary (Signed)
Physician Discharge Summary   Patient ID: Deanna Dorsey MRN: [id number] DOB/AGE: Jul 28, 1955 65 y.o.  Admit date: 09/16/2020 Discharge date: 09/18/2020  Primary Diagnosis: Osteoarthritis, left knee   Admission Diagnoses:  Past Medical History:  Diagnosis Date   Arthritis    Asthma 07/17/2020   vaccine triggered,  allergy triggered asthma   Family history of adverse reaction to anesthesia    sister slow to wake up   History of kidney stones    1982   PONV (postoperative nausea and vomiting)    Sleep apnea 2003   had surgical fix   Discharge Diagnoses:   Principal Problem:   OA (osteoarthritis) of knee Active Problems:   Status post revision of total knee replacement, left  Estimated body mass index is 27.3 kg/m as calculated from the following:   Height as of this encounter: _0  (1.6 m).   Weight as of this encounter: 69.9 kg.  Procedure:  Procedure(s) (LRB): LEFT TOTAL KNEE ARTHROPLASTY (Left)   Consults: None  HPI: Deanna Dorsey is a 65 y.o. year old female with end stage OA of her left knee with progressively worsening pain and dysfunction. She has constant pain, with activity and at rest and significant functional deficits with difficulties even with ADLs. She has had extensive non-op management including analgesics, injections of cortisone and viscosupplements, and home exercise program, but remains in significant pain with significant dysfunction. Radiographs show bone on bone arthritis medially. She presents now for left Total Knee Arthroplasty.       Laboratory Data: Admission on 09/16/2020, Discharged on 09/18/2020  Component Date Value Ref Range Status   WBC 09/17/2020 13.4 (A) 4.0 - 10.5 K/uL Final   RBC 09/17/2020 3.55 (A) 3.87 - 5.11 MIL/uL Final   Hemoglobin 09/17/2020 11.3 (A) 12.0 - 15.0 g/dL Final   HCT 09/17/2020 33.9 (A) 36.0 - 46.0 % Final   MCV 09/17/2020 95.5  80.0 - 100.0 fL Final   MCH 09/17/2020 31.8  26.0 - 34.0 pg Final   MCHC 09/17/2020  33.3  30.0 - 36.0 g/dL Final   RDW 09/17/2020 13.6  11.5 - 15.5 % Final   Platelets 09/17/2020 240  150 - 400 K/uL Final   nRBC 09/17/2020 0.0  0.0 - 0.2 % Final   Performed at [facility], [place] [address] [place], [place] [address]   Sodium 09/17/2020 136  135 - 145 mmol/L Final   Potassium 09/17/2020 4.0  3.5 - 5.1 mmol/L Final   Chloride 09/17/2020 104  98 - 111 mmol/L Final   CO2 09/17/2020 25  22 - 32 mmol/L Final   Glucose, Bld 09/17/2020 141 (A) 70 - 99 mg/dL Final   Glucose reference range applies only to samples taken after fasting for at least 8 hours.   BUN 09/17/2020 9  8 - 23 mg/dL Final   Creatinine, Ser 09/17/2020 0.65  0.44 - 1.00 mg/dL Final   Calcium 09/17/2020 8.4 (A) 8.9 - 10.3 mg/dL Final   GFR, Estimated 09/17/2020 >60  >60 mL/min Final   Comment: (NOTE) Calculated using the CKD-EPI Creatinine Equation (2021)    Anion gap 09/17/2020 7  5 - 15 Final   Performed at [facility], [place] [address] [place], [place] [address]   WBC 09/18/2020 11.1 (A) 4.0 - 10.5 K/uL Final   RBC 09/18/2020 3.25 (A) 3.87 - 5.11 MIL/uL Final   Hemoglobin 09/18/2020 10.6 (A) 12.0 - 15.0 g/dL Final   HCT 09/18/2020 31.5 (A) 36.0 -  46.0 % Final   MCV 09/18/2020 96.9  80.0 - 100.0 fL Final   MCH 09/18/2020 32.6  26.0 - 34.0 pg Final   MCHC 09/18/2020 33.7  30.0 - 36.0 g/dL Final   RDW 09/18/2020 13.9  11.5 - 15.5 % Final   Platelets 09/18/2020 226  150 - 400 K/uL Final   nRBC 09/18/2020 0.0  0.0 - 0.2 % Final   Performed at [facility], [place] [address], [place], [place] [address]   Sodium 09/18/2020 139  135 - 145 mmol/L Final   Potassium 09/18/2020 4.0  3.5 - 5.1 mmol/L Final   Chloride 09/18/2020 106  98 - 111 mmol/L Final   CO2 09/18/2020 27  22 - 32 mmol/L Final   Glucose, Bld 09/18/2020 112 (A) 70 - 99 mg/dL Final   Glucose reference range applies only to samples taken after fasting for at least 8 hours.   BUN 09/18/2020 11   8 - 23 mg/dL Final   Creatinine, Ser 09/18/2020 0.67  0.44 - 1.00 mg/dL Final   Calcium 09/18/2020 8.6 (A) 8.9 - 10.3 mg/dL Final   GFR, Estimated 09/18/2020 >60  >60 mL/min Final   Comment: (NOTE) Calculated using the CKD-EPI Creatinine Equation (2021)    Anion gap 09/18/2020 6  5 - 15 Final   Performed at [facility], [place] [address], [place], [place] [address]  Hospital Outpatient Visit on 09/15/2020  Component Date Value Ref Range Status   SARS Coronavirus 2 09/15/2020 NEGATIVE  NEGATIVE Final   Comment: (NOTE) SARS-CoV-2 target nucleic acids are NOT DETECTED.  The SARS-CoV-2 RNA is generally detectable in upper and lower respiratory specimens during the acute phase of infection. Negative results do not preclude SARS-CoV-2 infection, do not rule out co-infections with other pathogens, and should not be used as the sole basis for treatment or other patient management decisions. Negative results must be combined with clinical observations, patient history, and epidemiological information. The expected result is Negative.  Fact Sheet for Patients: SugarRoll.be  Fact Sheet for Healthcare Providers: https://www.woods-mathews.com/  This test is not yet approved or cleared by the [place] FDA and  has been authorized for detection and/or diagnosis of SARS-CoV-2 by FDA under an Emergency Use Authorization (EUA). This EUA will remain  in effect (meaning this test can be used) for the duration of the COVID-19 declaration under Se                          ction 564(b)(1) of the Act, 21 U.S.C. section 360bbb-3(b)(1), unless the authorization is terminated or revoked sooner.  Performed at [facility], [place] [address], [place],  [address]   Hospital Outpatient Visit on 09/07/2020  Component Date Value Ref Range Status   WBC 09/07/2020 6.1  4.0 - 10.5 K/uL Final   RBC 09/07/2020 4.61  3.87 - 5.11 MIL/uL  Final   Hemoglobin 09/07/2020 14.7  12.0 - 15.0 g/dL Final   HCT 09/07/2020 43.7  36.0 - 46.0 % Final   MCV 09/07/2020 94.8  80.0 - 100.0 fL Final   MCH 09/07/2020 31.9  26.0 - 34.0 pg Final   MCHC 09/07/2020 33.6  30.0 - 36.0 g/dL Final   RDW 09/07/2020 13.5  11.5 - 15.5 % Final   Platelets 09/07/2020 313  150 - 400 K/uL Final   nRBC 09/07/2020 0.0  0.0 - 0.2 % Final   Performed at [facility], [address] [place], [place] [address]   Sodium 09/07/2020 137  135 - 145 mmol/L Final   Potassium 09/07/2020 4.0  3.5 - 5.1 mmol/L Final   Chloride 09/07/2020 102  98 - 111 mmol/L Final   CO2 09/07/2020 26  22 - 32 mmol/L Final   Glucose, Bld 09/07/2020 89  70 - 99 mg/dL Final   Glucose reference range applies only to samples taken after fasting for at least 8 hours.   BUN 09/07/2020 17  8 - 23 mg/dL Final   Creatinine, Ser 09/07/2020 0.60  0.44 - 1.00 mg/dL Final   Calcium 09/07/2020 9.5  8.9 - 10.3 mg/dL Final   Total Protein 09/07/2020 7.4  6.5 - 8.1 g/dL Final   Albumin 09/07/2020 4.4  3.5 - 5.0 g/dL Final   AST 09/07/2020 17  15 - 41 U/L Final   ALT 09/07/2020 28  0 - 44 U/L Final   Alkaline Phosphatase 09/07/2020 73  38 - 126 U/L Final   Total Bilirubin 09/07/2020 0.6  0.3 - 1.2 mg/dL Final   GFR, Estimated 09/07/2020 >60  >60 mL/min Final   Comment: (NOTE) Calculated using the CKD-EPI Creatinine Equation (2021)    Anion gap 09/07/2020 9  5 - 15 Final   Performed at [facility], [place] [address] [place], [place] [address]   Prothrombin Time 09/07/2020 12.1  11.4 - 15.2 seconds Final   INR 09/07/2020 0.9  0.8 - 1.2 Final   Comment: (NOTE) INR goal varies based on device and disease states. Performed at [facility], [place] [address] [place], [place] [address]    MRSA, PCR 09/07/2020 NEGATIVE  NEGATIVE Final   Staphylococcus aureus 09/07/2020 NEGATIVE  NEGATIVE Final   Comment: (NOTE) The Xpert SA Assay (FDA approved  for NASAL specimens in patients 52 years of age and older), is one component of a comprehensive surveillance program. It is not intended to diagnose infection nor to guide or monitor treatment. Performed at [facility], [place] [address], [place], [place] [address]   Admission on 08/25/2020, Discharged on 08/25/2020  Component Date Value Ref Range Status   SURGICAL PATHOLOGY 08/25/2020    Final-Edited                   Value:SURGICAL PATHOLOGY CASE: [id number] PATIENT: [facility] Surgical Pathology Report     Clinical History: left breast mass, family history of breast cancer (cm)   FINAL MICROSCOPIC DIAGNOSIS:  A. BREAST, LEFT, LUMPECTOMY: -  Complex sclerosing lesion with usual ductal hyperplasia -  Previous biopsy site changes present -  No malignancy identified   GROSS DESCRIPTION:  Specimen type: Left breast lumpectomy, in formalin at 1057 hrs. Size: 4.1 cm from anterior to posterior, 3 cm from superior to inferior, ranges from 1.3 to 2 cm from medial to lateral. Orientation: Green Anterior, Blue Inferior, Orange Lateral, Yellow Medial, Black Posterior, Red Superior. Localized area: A radioactive seed is found Cut surface: .  The specimen consists probably of pink-white soft to rubbery and diffusely nodular fibrous tissue, with intermixed soft fatty tissue.  Found within 1 area of nodular fibrous tissue is an embedded clip.  A discrete mass is not identified Margins:                          .The clip is embedded adjacent to the juncture of superior and medial margins.  Nodular tissue abuts multiple margins. Prognostic indicators: Not  taken at gross. Block summary: Blocks 1-4 = tissue at and around clip, with nearest superior, inferior, medial and lateral margins Blocks 5-7 = tissue away from clip, including anterior and posterior margins  SW 08/25/2020    Final Diagnosis performed by Thressa Sheller, MD.   Electronically  signed 08/26/2020 Technical and / or Professional components performed at [employer]. [facility], [place] [address], [place], [place] [address].  Immunohistochemistry Technical component (if applicable) was performed at [facility]. [address], [place], [place], [place] [address].   IMMUNOHISTOCHEMISTRY DISCLAIMER (if applicable): Some of these immunohistochemical stains may have been developed and the performance characteristics determine by [facility]. Some may not have been cleared or approved by                          the U.S. Food and Drug Administration. The FDA has determined that such clearance or approval is not necessary. This test is used for clinical purposes. It should not be regarded as investigational or for research. This laboratory is certified under the [place] (CLIA-88) as qualified to perform high complexity clinical laboratory testing.  The controls stained appropriately.   Clinical Support on 08/12/2020  Component Date Value Ref Range Status   FVC-Pre 08/12/2020 3.03  L Preliminary   FVC-%Pred-Pre 08/12/2020 106  % Preliminary   FVC-Post 08/12/2020 2.98  L Preliminary   FVC-%Pred-Post 08/12/2020 104  % Preliminary   FVC-%Change-Post 08/12/2020 -1  % Preliminary   FEV1-Pre 08/12/2020 2.67  L Preliminary   FEV1-%Pred-Pre 08/12/2020 120  % Preliminary   FEV1-Post 08/12/2020 2.64  L Preliminary   FEV1-%Pred-Post 08/12/2020 119  % Preliminary   FEV1-%Change-Post 08/12/2020 -1  % Preliminary   FEV6-Pre 08/12/2020 2.99  L Preliminary   FEV6-%Pred-Pre 08/12/2020 104  % Preliminary   FEV6-Post 08/12/2020 2.98  L Preliminary   FEV6-%Pred-Post 08/12/2020 104  % Preliminary   FEV6-%Change-Post 08/12/2020 0  % Preliminary   Pre FEV1/FVC ratio 08/12/2020 88  % Preliminary   FEV1FVC-%Pred-Pre 08/12/2020 112  % Preliminary   Post FEV1/FVC ratio 08/12/2020 89  % Preliminary    FEV1FVC-%Change-Post 08/12/2020 0  % Preliminary   Pre FEV6/FVC Ratio 08/12/2020 100  % Preliminary   FEV6FVC-%Pred-Pre 08/12/2020 99  % Preliminary   Post FEV6/FVC ratio 08/12/2020 100  % Preliminary   FEV6FVC-%Pred-Post 08/12/2020 99  % Preliminary   FEF 25-75 Pre 08/12/2020 4.11  L/sec Preliminary   FEF2575-%Pred-Pre 08/12/2020 211  % Preliminary   FEF 25-75 Post 08/12/2020 3.82  L/sec Preliminary   FEF2575-%Pred-Post 08/12/2020 197  % Preliminary   FEF2575-%Change-Post 08/12/2020 -6  % Preliminary   RV 08/12/2020 1.83  L Preliminary   RV % pred 08/12/2020 87  % Preliminary   TLC 08/12/2020 5.16  L Preliminary   TLC % pred 08/12/2020 105  % Preliminary   DLCO unc 08/12/2020 24.12  ml/min/mmHg Preliminary   DLCO unc % pred 08/12/2020 105  % Preliminary   DLCO cor 08/12/2020 24.12  ml/min/mmHg Preliminary   DLCO cor % pred 08/12/2020 105  % Preliminary   DL/VA 08/12/2020 5.14  ml/min/mmHg/L Preliminary   DL/VA % pred 08/12/2020 110  % Preliminary     X-Rays:MM Breast Surgical Specimen  Result Date: 08/25/2020 CLINICAL DATA:  Status post seed localized lumpectomy of the LEFT breast. EXAM: SPECIMEN RADIOGRAPH OF THE LEFT BREAST COMPARISON:  Previous exam(s). FINDINGS: Status post excision of the left breast.  The radioactive seed and coil shaped biopsy marker clip are present and intact. The findings are discussed with the operating room nurse at the time of interpretation. IMPRESSION: Specimen radiograph of the left breast. Electronically Signed   By: Nolon Nations M.D.   On: 08/25/2020 10:39  MM LT RADIOACTIVE SEED LOC MAMMO GUIDE  Result Date: 08/24/2020 CLINICAL DATA:  Pre surgical excision of a recently diagnosed left breast complex sclerosing lesion. There was 1.5 cm of inferior migration of the coil shaped biopsy clip in the upper central left breast on the post biopsy mammogram. EXAM: MAMMOGRAPHIC GUIDED RADIOACTIVE SEED LOCALIZATION OF THE LEFT BREAST COMPARISON:  Previous  exam(s). FINDINGS: Patient presents for radioactive seed localization prior to left breast excision. I met with the patient and we discussed the procedure of seed localization including benefits and alternatives. We discussed the high likelihood of a successful procedure. We discussed the risks of the procedure including infection, bleeding, tissue injury and further surgery. We discussed the low dose of radioactivity involved in the procedure. Informed, written consent was given. The usual time-out protocol was performed immediately prior to the procedure. Using mammographic guidance, sterile technique, 1% lidocaine and an I-125 radioactive seed, the area biopsied 1.5 cm superior to the coil shaped biopsy marker clip was localized using a cephalad approach. The clip had returned to a more appropriate location at the inferior aspect of the biopsied distortion. The follow-up mammogram images confirm the seed in the expected location and were marked for Dr. Donne Hazel. Follow-up survey of the patient confirms presence of the radioactive seed. Order number of I-125 seed:  [id number]. Total activity:  0.244 mCi reference Date: 08/03/2020 The patient tolerated the procedure well and was released from the [place]. She was given instructions regarding seed removal. IMPRESSION: Radioactive seed localization left breast. No apparent complications. Electronically Signed   By: Claudie Revering M.D.   On: 08/24/2020 14:18   EKG: Orders placed or performed during the hospital encounter of 02/04/19   EKG   EKG     Hospital Course: Deanna Dorsey is a 65 y.o. who was admitted to [facility]. They were brought to the operating room on 09/16/2020 and underwent Procedure(s): LEFT TOTAL KNEE ARTHROPLASTY.  Patient tolerated the procedure well and was later transferred to the recovery room and then to the orthopaedic floor for postoperative care. They were given PO and IV analgesics for pain control following their  surgery. They were given 24 hours of postoperative antibiotics of  Anti-infectives (From admission, onward)    Start     Dose/Rate Route Frequency Ordered Stop   09/16/20 1630  ceFAZolin (ANCEF) IVPB 2g/100 mL premix        2 g 200 mL/hr over 30 Minutes Intravenous Every 6 hours 09/16/20 1330 09/16/20 2231   09/16/20 0815  ceFAZolin (ANCEF) IVPB 2g/100 mL premix        2 g 200 mL/hr over 30 Minutes Intravenous On call to O.R. 09/16/20 0809 09/16/20 1052      and started on DVT prophylaxis in the form of Xarelto.   PT and OT were ordered for total joint protocol. Discharge planning consulted to help with postop disposition and equipment needs. Patient had a good night on the evening of surgery. They started to get up OOB with therapy on POD #0. Continued to work with therapy into POD #2. Pt was seen during rounds on day two and was ready to go home pending progress with therapy. Dressing was  changed and the incision was clean, dry, and intact. Pt worked with therapy for two additional sessions and was meeting their goals. She was discharged to home later that day in stable condition.  Diet: Regular diet Activity: WBAT Follow-up: in 2 weeks Disposition: Home with OPPT Discharged Condition: stable   Discharge Instructions     Call MD / Call 911   Complete by: As directed    If you experience chest pain or shortness of breath, CALL 911 and be transported to the hospital emergency room.  If you develope a fever above 101 F, pus (white drainage) or increased drainage or redness at the wound, or calf pain, call your surgeon's office.   Call MD / Call 911   Complete by: As directed    If you experience chest pain or shortness of breath, CALL 911 and be transported to the hospital emergency room.  If you develope a fever above 101 F, pus (white drainage) or increased drainage or redness at the wound, or calf pain, call your surgeon's office.   Change dressing   Complete by: As directed    You  may remove the bulky bandage (ACE wrap and gauze) two days after surgery. You will have an adhesive waterproof bandage underneath. Leave this in place until your first follow-up appointment.   Change dressing   Complete by: As directed    You may remove the bulky bandage (ACE wrap and gauze) two days after surgery. You will have an adhesive waterproof bandage underneath. Leave this in place until your first follow-up appointment.   Constipation Prevention   Complete by: As directed    Drink plenty of fluids.  Prune juice may be helpful.  You may use a stool softener, such as Colace (over the counter) 100 mg twice a day.  Use MiraLax (over the counter) for constipation as needed.   Constipation Prevention   Complete by: As directed    Drink plenty of fluids.  Prune juice may be helpful.  You may use a stool softener, such as Colace (over the counter) 100 mg twice a day.  Use MiraLax (over the counter) for constipation as needed.   Diet - low sodium heart healthy   Complete by: As directed    Diet - low sodium heart healthy   Complete by: As directed    Do not put a pillow under the knee. Place it under the heel.   Complete by: As directed    Do not put a pillow under the knee. Place it under the heel.   Complete by: As directed    Driving restrictions   Complete by: As directed    No driving for two weeks   Driving restrictions   Complete by: As directed    No driving for two weeks   Post-operative opioid taper instructions:   Complete by: As directed    POST-OPERATIVE OPIOID TAPER INSTRUCTIONS: It is important to wean off of your opioid medication as soon as possible. If you do not need pain medication after your surgery it is ok to stop day one. Opioids include: Codeine, Hydrocodone(Norco, Vicodin), Oxycodone(Percocet, oxycontin) and hydromorphone amongst others.  Long term and even short term use of opiods can cause: Increased pain  response Dependence Constipation Depression Respiratory depression And more.  Withdrawal symptoms can include Flu like symptoms Nausea, vomiting And more Techniques to manage these symptoms Hydrate well Eat regular healthy meals Stay active Use relaxation techniques(deep breathing, meditating, yoga) Do Not substitute Alcohol  to help with tapering If you have been on opioids for less than two weeks and do not have pain than it is ok to stop all together.  Plan to wean off of opioids This plan should start within one week post op of your joint replacement. Maintain the same interval or time between taking each dose and first decrease the dose.  Cut the total daily intake of opioids by one tablet each day Next start to increase the time between doses. The last dose that should be eliminated is the evening dose.      Post-operative opioid taper instructions:   Complete by: As directed    POST-OPERATIVE OPIOID TAPER INSTRUCTIONS: It is important to wean off of your opioid medication as soon as possible. If you do not need pain medication after your surgery it is ok to stop day one. Opioids include: Codeine, Hydrocodone(Norco, Vicodin), Oxycodone(Percocet, oxycontin) and hydromorphone amongst others.  Long term and even short term use of opiods can cause: Increased pain response Dependence Constipation Depression Respiratory depression And more.  Withdrawal symptoms can include Flu like symptoms Nausea, vomiting And more Techniques to manage these symptoms Hydrate well Eat regular healthy meals Stay active Use relaxation techniques(deep breathing, meditating, yoga) Do Not substitute Alcohol to help with tapering If you have been on opioids for less than two weeks and do not have pain than it is ok to stop all together.  Plan to wean off of opioids This plan should start within one week post op of your joint replacement. Maintain the same interval or time between taking  each dose and first decrease the dose.  Cut the total daily intake of opioids by one tablet each day Next start to increase the time between doses. The last dose that should be eliminated is the evening dose.      TED hose   Complete by: As directed    Use stockings (TED hose) for three weeks on both leg(s).  You may remove them at night for sleeping.   TED hose   Complete by: As directed    Use stockings (TED hose) for three weeks on both leg(s).  You may remove them at night for sleeping.   Weight bearing as tolerated   Complete by: As directed    Weight bearing as tolerated   Complete by: As directed       Allergies as of 09/18/2020       Reactions   Doxycycline Anaphylaxis, Rash   Pt started swelling and breaking out with rash per dr         Medication List     STOP taking these medications    diclofenac Sodium 1 % Gel Commonly known as: VOLTAREN   NONFORMULARY OR COMPOUNDED ITEM   ondansetron 4 MG tablet Commonly known as: Zofran       TAKE these medications    acetaminophen 500 MG tablet Commonly known as: TYLENOL Take 1,000 mg by mouth every 6 (six) hours as needed for moderate pain.   atorvastatin 10 MG tablet Commonly known as: LIPITOR Take 10 mg by mouth daily.   esomeprazole 40 MG capsule Commonly known as: NEXIUM Take 40 mg by mouth daily.   gabapentin 300 MG capsule Commonly known as: NEURONTIN Take 1 capsule three times a day for two weeks following surgery, then take 1 capsule two times a day for two weeks, then take 1 capsule once a day for two weeks. Then discontinue.   HYDROcodone-acetaminophen 5-325 MG  tablet Commonly known as: NORCO/VICODIN Take 1-2 tablets by mouth every 6 (six) hours as needed for severe pain.   methocarbamol 500 MG tablet Commonly known as: ROBAXIN Take 1 tablet (500 mg total) by mouth every 6 (six) hours as needed for muscle spasms.   montelukast 10 MG tablet Commonly known as: SINGULAIR Take 1 tablet (10  mg total) by mouth at bedtime.   progesterone 100 MG vaginal insert Commonly known as: ENDOMETRIN Place 100 mg vaginally daily.   promethazine 12.5 MG tablet Commonly known as: PHENERGAN Take 1 tablet (12.5 mg total) by mouth every 6 (six) hours as needed for nausea or vomiting.   traMADol 50 MG tablet Commonly known as: ULTRAM Take 1-2 tablets (50-100 mg total) by mouth every 6 (six) hours as needed for moderate pain.   Xarelto 10 MG Tabs tablet Generic drug: rivaroxaban Take 1 tablet (10 mg total) by mouth daily with breakfast for 20 days. Then take one 81 mg aspirin once a day for three weeks. Then discontinue aspirin.       ASK your doctor about these medications    fluticasone 50 MCG/ACT nasal spray Commonly known as: FLONASE Place 1 spray into both nostrils daily.   Trelegy Ellipta 200-62.5-25 MCG/INH Aepb Generic drug: Fluticasone-Umeclidin-Vilant Inhale 1 puff into the lungs daily.               Discharge Care Instructions  (From admission, onward)           Start     Ordered   09/18/20 0000  Weight bearing as tolerated        09/18/20 0825   09/18/20 0000  Change dressing       Comments: You may remove the bulky bandage (ACE wrap and gauze) two days after surgery. You will have an adhesive waterproof bandage underneath. Leave this in place until your first follow-up appointment.   09/18/20 0825   09/17/20 0000  Weight bearing as tolerated        09/17/20 0747   09/17/20 0000  Change dressing       Comments: You may remove the bulky bandage (ACE wrap and gauze) two days after surgery. You will have an adhesive waterproof bandage underneath. Leave this in place until your first follow-up appointment.   09/17/20 0747            Follow-up Information     Gaynelle Arabian, MD. Go on 09/29/2020.   Specialty: Orthopedic Surgery Why: You are scheduled for first post op appointment on Tuesday July 19th at 2:00pm. Contact information: [address] [place] [address] [phone]                 Signed: Theresa Duty, PA-C Orthopedic Surgery 09/21/2020, 7:43 AM

## 2020-11-20 ENCOUNTER — Ambulatory Visit: Payer: Medicare Other | Admitting: Pulmonary Disease

## 2020-11-26 ENCOUNTER — Ambulatory Visit: Payer: Medicare Other | Admitting: Pulmonary Disease

## 2021-01-05 NOTE — H&P (Signed)
TOTAL KNEE REVISION ADMISSION H&P  Subjective:  Chief Complaint: Failed right total knee arthroplasty  HPI: Deanna Dorsey, 65 y.o. female presents for pre-operative visit in preparation for their right total knee arthroplasty revision, which is scheduled on 01/27/2021 with Dr. Wynelle Link at [facility]. The patient has had symptoms in the right knee including continued pain and instability which has impacted their quality of life and ability to do activities of daily living. The patient currently has a diagnosis of failed right total knee arthroplasty and has failed conservative treatments including activity modification and physical therapy. Bone scan from January revealed increased uptake consistent with aspectic loosening. The patient denies an active infection.  Patient Active Problem List   Diagnosis Date Noted   OA (osteoarthritis) of knee 09/16/2020   Status post revision of total knee replacement, left 09/16/2020   Arthrofibrosis of total knee arthroplasty, initial encounter ([place]) 02/06/2019   Arthrofibrosis of total knee arthroplasty ([place]) 02/06/2019    Past Medical History:  Diagnosis Date   Arthritis    Asthma 07/17/2020   vaccine triggered,  allergy triggered asthma   Family history of adverse reaction to anesthesia    sister slow to wake up   History of kidney stones    1982   PONV (postoperative nausea and vomiting)    Sleep apnea 2003   had surgical fix    Past Surgical History:  Procedure Laterality Date   ABDOMINAL HYSTERECTOMY  2006   BACK SURGERY     2016   KNEE ARTHROTOMY Right 02/06/2019   Procedure: KNEE ARTHROTOMY; SCAR EXCISION;  Surgeon: Gaynelle Arabian, MD;  Location: [facility];  Service: Orthopedics;  Laterality: Right;  16min   KNEE CARTILAGE SURGERY Right 2006   MENISCUS REPAIR Right 2016   NASAL SINUS SURGERY  2004   RADIOACTIVE SEED GUIDED EXCISIONAL BREAST BIOPSY Left 08/25/2020   Procedure: RADIOACTIVE SEED GUIDED EXCISIONAL LEFT BREAST  BIOPSY;  Surgeon: Rolm Bookbinder, MD;  Location: [place];  Service: General;  Laterality: Left;   REPLACEMENT TOTAL KNEE Right 2019   TONSILLECTOMY     2004   TOTAL KNEE ARTHROPLASTY Left 09/16/2020   Procedure: LEFT TOTAL KNEE ARTHROPLASTY;  Surgeon: Gaynelle Arabian, MD;  Location: [facility];  Service: Orthopedics;  Laterality: Left;    Prior to Admission medications   Medication Sig Start Date End Date Taking? Authorizing Provider  acetaminophen (TYLENOL) 500 MG tablet Take 1,000 mg by mouth every 6 (six) hours as needed for moderate pain.    [provider]  atorvastatin (LIPITOR) 10 MG tablet Take 10 mg by mouth daily. 01/14/19   [provider]  esomeprazole (NEXIUM) 40 MG capsule Take 40 mg by mouth daily.    [provider]  fluticasone (FLONASE) 50 MCG/ACT nasal spray Place 1 spray into both nostrils daily. Patient taking differently: Place 1 spray into both nostrils in the morning. 08/12/20   Hunsucker, Bonna Gains, MD  Fluticasone-Umeclidin-Vilant (TRELEGY ELLIPTA) 200-62.5-25 MCG/INH AEPB Inhale 1 puff into the lungs daily. Patient taking differently: Inhale 1 puff into the lungs in the morning and at bedtime. 07/17/20   Hunsucker, Bonna Gains, MD  gabapentin (NEURONTIN) 300 MG capsule Take 1 capsule three times a day for two weeks following surgery, then take 1 capsule two times a day for two weeks, then take 1 capsule once a day for two weeks. Then discontinue. 09/17/20   Mervil Wacker, Ok Anis, PA  HYDROcodone-acetaminophen (NORCO/VICODIN) 5-325 MG tablet Take 1-2 tablets by mouth  every 6 (six) hours as needed for severe pain. 09/17/20   Joletta Manner, Ok Anis, PA  methocarbamol (ROBAXIN) 500 MG tablet Take 1 tablet (500 mg total) by mouth every 6 (six) hours as needed for muscle spasms. 09/17/20   Kaydan Wong L, PA  montelukast (SINGULAIR) 10 MG tablet Take 1 tablet (10 mg total) by mouth at bedtime. 08/12/20   Hunsucker, Bonna Gains, MD  progesterone  (ENDOMETRIN) 100 MG vaginal insert Place 100 mg vaginally daily.    [provider]  promethazine (PHENERGAN) 12.5 MG tablet Take 1 tablet (12.5 mg total) by mouth every 6 (six) hours as needed for nausea or vomiting. 09/17/20   Odus Clasby, Ok Anis, PA  traMADol (ULTRAM) 50 MG tablet Take 1-2 tablets (50-100 mg total) by mouth every 6 (six) hours as needed for moderate pain. 09/17/20   Alynn Ellithorpe, Ok Anis, PA    Allergies  Allergen Reactions   Doxycycline Anaphylaxis and Rash    Pt started swelling and breaking out with rash per dr     Social History   Socioeconomic History   Marital status: Widowed    Spouse name: Not on file   Number of children: Not on file   Years of education: Not on file   Highest education level: Not on file  Occupational History   Not on file  Tobacco Use   Smoking status: Never   Smokeless tobacco: Never  Vaping Use   Vaping Use: Never used  Substance and Sexual Activity   Alcohol use: Yes    Alcohol/week: 1.0 standard drink    Types: 1 Glasses of wine per week    Comment: rare   Drug use: Never   Sexual activity: Not on file  Other Topics Concern   Not on file  Social History Narrative   Not on file   Social Determinants of Health   Financial Resource Strain: Not on file  Food Insecurity: Not on file  Transportation Needs: Not on file  Physical Activity: Not on file  Stress: Not on file  Social Connections: Not on file  Intimate Partner Violence: Not on file    Tobacco Use: Low Risk    Smoking Tobacco Use: Never   Smokeless Tobacco Use: Never   Passive Exposure: Not on file   Social History   Substance and Sexual Activity  Alcohol Use Yes   Alcohol/week: 1.0 standard drink   Types: 1 Glasses of wine per week   Comment: rare    No family history on file.  Review of Systems  Constitutional:  Negative for chills and fever.  HENT:  Negative for congestion, sore throat and tinnitus.   Eyes:  Negative for double vision,  photophobia and pain.  Respiratory:  Negative for cough, shortness of breath and wheezing.   Cardiovascular:  Negative for chest pain, palpitations and orthopnea.  Gastrointestinal:  Negative for heartburn, nausea and vomiting.  Genitourinary:  Negative for dysuria, frequency and urgency.  Musculoskeletal:  Positive for joint pain.  Neurological:  Negative for dizziness, weakness and headaches.   Objective:  Physical Exam: Well nourished and well developed.  General: Alert and oriented x3, cooperative and pleasant, no acute distress.  Head: normocephalic, atraumatic, neck supple.  Eyes: EOMI.  Respiratory: breath sounds clear in all fields, no wheezing, rales, or rhonchi. Cardiovascular: Regular rate and rhythm, no murmurs, gallops or rubs.  Abdomen: non-tender to palpation and soft, normoactive bowel sounds. Musculoskeletal:  Right Knee Exam:  No warmth or effusion.  Range of  motion is 5 to 85 or 90 degrees.  No crepitus on range of motion of the knee.  Positive medial joint line tenderness.  Stable knee.   Calves soft and nontender. Motor function intact in LE. Strength 5/5 LE bilaterally. Neuro: Distal pulses 2+. Sensation to light touch intact in LE.  Imaging: Nuclear bone scan from 04/07/2020 showed increased uptake consistent with aseptic loosening.   Assessment/Plan:  Failed right total knee arthroplasty  The treatment options including medical management, injection therapy arthroscopy and arthroplasty were discussed at length. The risks and benefits of total knee arthroplasty were presented and reviewed. The risks due to aseptic loosening, infection, stiffness, patella tracking problems, thromboembolic complications and other imponderables were discussed. The patient acknowledged the explanation, agreed to proceed with the plan and consent was signed. Patient is being admitted for inpatient treatment for surgery, pain control, PT, OT, prophylactic antibiotics, VTE  prophylaxis, progressive ambulation and ADLs and discharge planning. The patient is planning to be discharged  home .  Therapy Plans: Outpatient therapy at [facility] Disposition: Home with family Planned DVT Prophylaxis: Xarelto 10 mg (hx skin CA) DME Needed: None PCP: Lanelle Bal, PA-C (had appt 9/30) TXA: IV Allergies: Doxycycline (anaphylaxis) Anesthesia Concerns: Nausea BMI: 26 Last HgbA1c: Not diabetic.  Pharmacy: [facility]  Other: - Had yearly exam with Lanelle Bal on 9/30. Taking clearance form by her office.  - Hydrocodone, phenergan, tramadol, xarelto, methocarbamol with left TKA - Did not do gabapentin taper with left TKA. Had issues with nerve pain following, will prescribe this time.   - Patient was instructed on what medications to stop prior to surgery. - Follow-up visit in 2 weeks with Dr. Wynelle Link - Begin physical therapy following surgery - Pre-operative lab work as pre-surgical testing - Prescriptions will be provided in hospital at time of discharge  Theresa Duty, PA-C Orthopedic Surgery [facility] [place]

## 2021-01-18 NOTE — Patient Instructions (Addendum)
DUE TO COVID-19 ONLY ONE VISITOR IS ALLOWED TO COME WITH YOU AND STAY IN THE WAITING ROOM ONLY DURING PRE OP AND PROCEDURE DAY OF SURGERY IF YOU ARE GOING HOME AFTER SURGERY. IF YOU ARE SPENDING THE NIGHT 2 PEOPLE MAY VISIT WITH YOU IN YOUR PRIVATE ROOM AFTER SURGERY UNTIL VISITING  HOURS ARE OVER AT 800 PM AND 1 VISITOR CAN SPEND THE NIGHT.   YOU NEED TO HAVE A COVID 19 TEST ON_ 11/14___  between 8am-3pm  _THIS TEST MUST BE DONE BEFORE SURGERY,   COVID TESTING SITE  IS LOCATED AT [facility].  REMAIN IN YOUR CAR THIS IS A DRIVE UP TEST.  AFTER YOUR COVID TEST PLEASE WEAR A MASK OUT IN PUBLIC AND SOCIAL DISTANCE AND [place] YOUR HANDS FREQUENTLY, ALSO ASK ALL YOUR CLOSE CONTACT PERSONS TO WEAR A MASK AND SOCIAL DISTANCE AND [place] THEIR HANDS FREQUENTLY ALSO.               Deanna Dorsey     Your procedure is scheduled on: 01/27/21   Report to [facility] Main  Entrance   Report to short stay at [facility]  AM     Call this number if you have problems the morning of surgery [phone]    No food after midnight.    You may have clear liquid until 5:30 AM.    At 5:00 AM drink pre surgery drink.   Nothing by mouth after 5:30 AM.   CLEAR LIQUID DIET   Foods Allowed                                                                                                      Foods Excluded      Black coffee no creamer                                                                                 liquids that you cannot  Plain Jell-O any favor except red or purple                                                           see through such as: Fruit ices (not with fruit pulp)  milk, soups, orange juice  Iced Popsicles                                                                           All solid food Carbonated beverages, regular and diet                                    Cranberry, grape and  apple juices Sports drinks like Gatorade Lightly seasoned clear broth or consume(fat free) Sugar    BRUSH YOUR TEETH MORNING OF SURGERY AND RINSE YOUR MOUTH OUT, NO CHEWING GUM CANDY OR MINTS.     Take these medicines the morning of surgery with A SIP OF WATER: Bring your inhaler and   CPAP mask and tubing to the hospital  You may take Nexium if you want                                You may not have any metal on your body including hair pins and              piercings  Do not wear jewelry, make-up, lotions, powders or perfumes, deodorant             Do not wear nail polish on your finger or toenails.  Do not shave  48 hours prior to surgery.               Do not bring valuables to the hospital. [place].  Contacts, dentures or bridgework may not be worn into surgery.  Leave suitcase in the car. After surgery it may be brought to your room.               [facility] - Preparing for Surgery Before surgery, you can play an important role.  Because skin is not sterile, your skin needs to be as free of germs as possible.  You can reduce the number of germs on your skin by washing with CHG (chlorahexidine gluconate) soap before surgery.  CHG is an antiseptic cleaner which kills germs and bonds with the skin to continue killing germs even after washing. Please DO NOT use if you have an allergy to CHG or antibacterial soaps.  If your skin becomes reddened/irritated stop using the CHG and inform your nurse when you arrive at Short Stay. Do not shave (including legs and underarms) for at least 48 hours prior to the first CHG shower.  Please follow these instructions carefully:  1.  Shower with CHG Soap the night before surgery and the  morning of Surgery.  2.  If you choose to wash your hair, wash your hair first as usual with your  normal  shampoo.  3.  After you shampoo, rinse your hair and body thoroughly to remove the  shampoo.                             4.  Use CHG  as you would any other liquid soap.  You can apply chg directly  to the skin and wash                       Gently with a scrungie or clean washcloth.  5.  Apply the CHG Soap to your body ONLY FROM THE NECK DOWN.   Do not use on face/ open                           Wound or open sores. Avoid contact with eyes, ears mouth and genitals (private parts).                       Wash face,  Genitals (private parts) with your normal soap.             6.  Wash thoroughly, paying special attention to the area where your surgery  will be performed.  7.  Thoroughly rinse your body with warm water from the neck down.  8.  DO NOT shower/wash with your normal soap after using and rinsing off  the CHG Soap.                9.  Pat yourself dry with a clean towel.            10.  Wear clean pajamas.            11.  Place clean sheets on your bed the night of your first shower and do not  sleep with pets. Day of Surgery : Do not apply any lotions/deodorants the morning of surgery.  Please wear clean clothes to the hospital/surgery center.  FAILURE TO FOLLOW THESE INSTRUCTIONS MAY RESULT IN THE CANCELLATION OF YOUR SURGERY PATIENT SIGNATURE_________________________________  NURSE SIGNATURE__________________________________  ________________________________________________________________________   Deanna Dorsey  An incentive spirometer is a tool that can help keep your lungs clear and active. This tool measures how well you are filling your lungs with each breath. Taking long deep breaths may help reverse or decrease the chance of developing breathing (pulmonary) problems (especially infection) following: A long period of time when you are unable to move or be active. BEFORE THE PROCEDURE  If the spirometer includes an indicator to show your best effort, your nurse or respiratory therapist will set it to a desired goal. If possible, sit up straight or lean slightly forward.  Try not to slouch. Hold the incentive spirometer in an upright position. INSTRUCTIONS FOR USE  Sit on the edge of your bed if possible, or sit up as far as you can in bed or on a chair. Hold the incentive spirometer in an upright position. Breathe out normally. Place the mouthpiece in your mouth and seal your lips tightly around it. Breathe in slowly and as deeply as possible, raising the piston or the ball toward the top of the column. Hold your breath for 3-5 seconds or for as long as possible. Allow the piston or ball to fall to the bottom of the column. Remove the mouthpiece from your mouth and breathe out normally. Rest for a few seconds and repeat Steps 1 through 7 at least 10 times every 1-2 hours when you are awake. Take your time and take a few normal breaths between deep breaths. The spirometer may include an indicator to show your best effort. Use the indicator as a goal to work toward during each repetition. After each  set of 10 deep breaths, practice coughing to be sure your lungs are clear. If you have an incision (the cut made at the time of surgery), support your incision when coughing by placing a pillow or rolled up towels firmly against it. Once you are able to get out of bed, walk around indoors and cough well. You may stop using the incentive spirometer when instructed by your caregiver.  RISKS AND COMPLICATIONS Take your time so you do not get dizzy or light-headed. If you are in pain, you may need to take or ask for pain medication before doing incentive spirometry. It is harder to take a deep breath if you are having pain. AFTER USE Rest and breathe slowly and easily. It can be helpful to keep track of a log of your progress. Your caregiver can provide you with a simple table to help with this. If you are using the spirometer at home, follow these instructions: [place] IF:  You are having difficultly using the spirometer. You have trouble using the spirometer  as often as instructed. Your pain medication is not giving enough relief while using the spirometer. You develop fever of 100.5 F (38.1 C) or higher. SEEK IMMEDIATE MEDICAL CARE IF:  You cough up bloody sputum that had not been present before. You develop fever of 102 F (38.9 C) or greater. You develop worsening pain at or near the incision site. MAKE SURE YOU:  Understand these instructions. Will watch your condition. Will get help right away if you are not doing well or get worse. Document Released: 07/11/2006 Document Revised: 05/23/2011 Document Reviewed: 09/11/2006 [facility] Patient Information 2014 [place], [place].   ________________________________________________________________________

## 2021-01-19 ENCOUNTER — Other Ambulatory Visit: Payer: Self-pay

## 2021-01-19 ENCOUNTER — Encounter (HOSPITAL_COMMUNITY): Payer: Self-pay

## 2021-01-19 ENCOUNTER — Encounter (HOSPITAL_COMMUNITY)
Admission: RE | Admit: 2021-01-19 | Discharge: 2021-01-19 | Disposition: A | Discharge status: Home / Self Care | Payer: Medicare Other | Source: Ambulatory Visit | Attending: Orthopedic Surgery | Admitting: Orthopedic Surgery

## 2021-01-19 ENCOUNTER — Other Ambulatory Visit (HOSPITAL_COMMUNITY): Payer: Self-pay

## 2021-01-19 VITALS — BP 154/83 | HR 64 | Temp 98.4°F | Resp 16 | Ht 63.0 in | Wt 138.0 lb

## 2021-01-19 DIAGNOSIS — Z01818 Encounter for other preprocedural examination: Secondary | ICD-10-CM

## 2021-01-19 DIAGNOSIS — Z01812 Encounter for preprocedural laboratory examination: Secondary | ICD-10-CM | POA: Diagnosis present

## 2021-01-19 DIAGNOSIS — Z96652 Presence of left artificial knee joint: Secondary | ICD-10-CM

## 2021-01-19 HISTORY — DX: Pneumonia, unspecified organism: J18.9

## 2021-01-19 HISTORY — DX: Unspecified malignant neoplasm of skin, unspecified: C44.90

## 2021-01-19 LAB — CBC
HCT: 42.4 % (ref 36.0–46.0)
Hemoglobin: 14.1 g/dL (ref 12.0–15.0)
MCH: 31.6 pg (ref 26.0–34.0)
MCHC: 33.3 g/dL (ref 30.0–36.0)
MCV: 95.1 fL (ref 80.0–100.0)
Platelets: 286 10*3/uL (ref 150–400)
RBC: 4.46 MIL/uL (ref 3.87–5.11)
RDW: 12.9 % (ref 11.5–15.5)
WBC: 4.4 10*3/uL (ref 4.0–10.5)
nRBC: 0 % (ref 0.0–0.2)

## 2021-01-19 LAB — COMPREHENSIVE METABOLIC PANEL
ALT: 15 U/L (ref 0–44)
AST: 15 U/L (ref 15–41)
Albumin: 4.5 g/dL (ref 3.5–5.0)
Alkaline Phosphatase: 65 U/L (ref 38–126)
Anion gap: 6 (ref 5–15)
BUN: 19 mg/dL (ref 8–23)
CO2: 29 mmol/L (ref 22–32)
Calcium: 9.5 mg/dL (ref 8.9–10.3)
Chloride: 104 mmol/L (ref 98–111)
Creatinine, Ser: 0.57 mg/dL (ref 0.44–1.00)
GFR, Estimated: >60 mL/min (ref >60)
Glucose, Bld: 86 mg/dL (ref 70–99)
Potassium: 3.9 mmol/L (ref 3.5–5.1)
Sodium: 139 mmol/L (ref 135–145)
Total Bilirubin: 0.8 mg/dL (ref 0.3–1.2)
Total Protein: 7.6 g/dL (ref 6.5–8.1)

## 2021-01-19 LAB — PROTIME-INR
INR: 0.9 (ref 0.8–1.2)
Prothrombin Time: 12.5 seconds (ref 11.4–15.2)

## 2021-01-19 LAB — SURGICAL PCR SCREEN
MRSA, PCR: NEGATIVE
Staphylococcus aureus: NEGATIVE

## 2021-01-19 NOTE — Progress Notes (Addendum)
PCP - Dr. Lanelle Bal PA  Cardiologist - no  PPM/ICD -  Device Orders -  Rep Notified -   Chest x-ray -  EKG -  Stress Test -  ECHO -  Cardiac Cath -  Pulm function test 08-12-20 epic Sleep Study -  CPAP -   Fasting Blood Sugar -  Checks Blood Sugar _____ times a day  Blood Thinner Instructions: Aspirin Instructions:  ERAS Protcol - PRE-SURGERY Ensure   COVID TEST- 11-14  COVID vaccine -Only able to take one vaccine triggered allergic asthma attack  Activity--Able to walk  around home and yard without SOB Anesthesia review: Asthma after covid vaccine  no longer uses inhaler  Patient denies shortness of breath, fever, cough and chest pain at PAT appointment   All instructions explained to the patient, with a verbal understanding of the material. Patient agrees to go over the instructions while at home for a better understanding. Patient also instructed to self quarantine after being tested for COVID-19. The opportunity to ask questions was provided.

## 2021-01-25 ENCOUNTER — Other Ambulatory Visit: Payer: Self-pay | Admitting: Orthopedic Surgery

## 2021-01-25 LAB — SARS CORONAVIRUS 2 (TAT 6-24 HRS): SARS Coronavirus 2: NEGATIVE

## 2021-01-26 NOTE — Anesthesia Preprocedure Evaluation (Addendum)
Anesthesia Evaluation  Patient identified by MRN, date of birth, ID band Patient awake    Reviewed: Allergy & Precautions, NPO status , Patient's Chart, lab work & pertinent test results  History of Anesthesia Complications (+) PONV and history of anesthetic complications  Airway Mallampati: II  TM Distance: >3 FB Neck ROM: Full    Dental  (+) Dental Advisory Given, Teeth Intact   Pulmonary asthma , sleep apnea (s/p surgical procedure) ,    Pulmonary exam normal        Cardiovascular negative cardio ROS Normal cardiovascular exam     Neuro/Psych negative neurological ROS  negative psych ROS   GI/Hepatic Neg liver ROS, GERD  Medicated and Controlled,  Endo/Other  negative endocrine ROS  Renal/GU negative Renal ROS     Musculoskeletal  (+) Arthritis ,   Abdominal   Peds  Hematology negative hematology ROS (+)   Anesthesia Other Findings   Reproductive/Obstetrics                            Anesthesia Physical Anesthesia Plan  ASA: 2  Anesthesia Plan: Spinal   Post-op Pain Management:  [facility] for Post-op pain   Induction:   PONV Risk Score and Plan: 3 and Treatment may vary due to age or medical condition, Propofol infusion, Ondansetron and Scopolamine patch - Pre-op  Airway Management Planned: Natural Airway and Simple Face Mask  Additional Equipment: None  Intra-op Plan:   Post-operative Plan:   Informed Consent: I have reviewed the patients History and Physical, chart, labs and discussed the procedure including the risks, benefits and alternatives for the proposed anesthesia with the patient or authorized representative who has indicated his/her understanding and acceptance.       Plan Discussed with: CRNA and Anesthesiologist  Anesthesia Plan Comments: (Labs reviewed, platelets acceptable. Discussed risks and benefits of spinal, including spinal/epidural hematoma,  infection, failed block, and PDPH. Patient expressed understanding and wished to proceed. )       Anesthesia Quick Evaluation

## 2021-01-27 ENCOUNTER — Encounter (HOSPITAL_COMMUNITY)
Admission: RE | Disposition: A | Discharge status: Home / Self Care | Payer: Self-pay | Source: Home / Self Care | Attending: Orthopedic Surgery

## 2021-01-27 ENCOUNTER — Encounter (HOSPITAL_COMMUNITY): Payer: Self-pay | Admitting: Orthopedic Surgery

## 2021-01-27 ENCOUNTER — Inpatient Hospital Stay (HOSPITAL_COMMUNITY)
Admission: RE | Admit: 2021-01-27 | Discharge: 2021-01-28 | DRG: 468 | Disposition: A | Discharge status: Home / Self Care | Payer: Medicare Other | Attending: Orthopedic Surgery | Admitting: Orthopedic Surgery

## 2021-01-27 ENCOUNTER — Inpatient Hospital Stay (HOSPITAL_COMMUNITY): Payer: Medicare Other | Admitting: Anesthesiology

## 2021-01-27 ENCOUNTER — Other Ambulatory Visit: Payer: Self-pay

## 2021-01-27 DIAGNOSIS — T84092A Other mechanical complication of internal right knee prosthesis, initial encounter: Principal | ICD-10-CM | POA: Diagnosis present

## 2021-01-27 DIAGNOSIS — T84018A Broken internal joint prosthesis, other site, initial encounter: Secondary | ICD-10-CM

## 2021-01-27 DIAGNOSIS — Z85828 Personal history of other malignant neoplasm of skin: Secondary | ICD-10-CM

## 2021-01-27 DIAGNOSIS — Z881 Allergy status to other antibiotic agents status: Secondary | ICD-10-CM | POA: Diagnosis not present

## 2021-01-27 DIAGNOSIS — G4730 Sleep apnea, unspecified: Secondary | ICD-10-CM | POA: Diagnosis not present

## 2021-01-27 DIAGNOSIS — Z20822 Contact with and (suspected) exposure to covid-19: Secondary | ICD-10-CM | POA: Diagnosis not present

## 2021-01-27 DIAGNOSIS — M1711 Unilateral primary osteoarthritis, right knee: Secondary | ICD-10-CM | POA: Diagnosis not present

## 2021-01-27 DIAGNOSIS — Z79899 Other long term (current) drug therapy: Secondary | ICD-10-CM

## 2021-01-27 DIAGNOSIS — Z96652 Presence of left artificial knee joint: Secondary | ICD-10-CM | POA: Diagnosis not present

## 2021-01-27 DIAGNOSIS — Y831 Surgical operation with implant of artificial internal device as the cause of abnormal reaction of the patient, or of later complication, without mention of misadventure at the time of the procedure: Secondary | ICD-10-CM | POA: Diagnosis present

## 2021-01-27 DIAGNOSIS — M1990 Unspecified osteoarthritis, unspecified site: Secondary | ICD-10-CM | POA: Diagnosis not present

## 2021-01-27 DIAGNOSIS — Z96659 Presence of unspecified artificial knee joint: Secondary | ICD-10-CM

## 2021-01-27 DIAGNOSIS — Z96651 Presence of right artificial knee joint: Secondary | ICD-10-CM

## 2021-01-27 DIAGNOSIS — Y792 Prosthetic and other implants, materials and accessory orthopedic devices associated with adverse incidents: Secondary | ICD-10-CM | POA: Diagnosis present

## 2021-01-27 DIAGNOSIS — T8484XA Pain due to internal orthopedic prosthetic devices, implants and grafts, initial encounter: Secondary | ICD-10-CM | POA: Diagnosis present

## 2021-01-27 HISTORY — PX: TOTAL KNEE REVISION: SHX996

## 2021-01-27 LAB — TYPE AND SCREEN
ABO/RH(D): A POS
Antibody Screen: NEGATIVE

## 2021-01-27 SURGERY — TOTAL KNEE REVISION
Anesthesia: Spinal | Site: Knee | Laterality: Right

## 2021-01-27 MED ORDER — SODIUM CHLORIDE (PF) 0.9 % IJ SOLN
INTRAMUSCULAR | Status: AC
  Filled 2021-01-27: qty 10

## 2021-01-27 MED ORDER — METHOCARBAMOL 500 MG IVPB - SIMPLE MED
INTRAVENOUS | Status: AC
  Administered 2021-01-27: 500 mg via INTRAVENOUS
  Filled 2021-01-27: qty 50

## 2021-01-27 MED ORDER — PHENOL 1.4 % MT LIQD: 1.0000 | OROMUCOSAL | Status: DC | PRN

## 2021-01-27 MED ORDER — TRAMADOL HCL 50 MG PO TABS
ORAL_TABLET | ORAL | Status: AC
  Filled 2021-01-27: qty 2

## 2021-01-27 MED ORDER — BUPIVACAINE IN DEXTROSE 0.75-8.25 % IT SOLN
INTRATHECAL | Status: DC | PRN
  Administered 2021-01-27: 1.8 mL via INTRATHECAL

## 2021-01-27 MED ORDER — ORAL CARE MOUTH RINSE: 15.0000 mL | Freq: Once | OROMUCOSAL | Status: DC

## 2021-01-27 MED ORDER — BUPIVACAINE LIPOSOME 1.3 % IJ SUSP
INTRAMUSCULAR | Status: AC
  Filled 2021-01-27: qty 20

## 2021-01-27 MED ORDER — DIPHENHYDRAMINE HCL 25 MG PO CAPS
ORAL_CAPSULE | ORAL | Status: AC
  Filled 2021-01-27: qty 1

## 2021-01-27 MED ORDER — ONDANSETRON HCL 4 MG/2ML IJ SOLN: 4.0000 mg | Freq: Once | INTRAMUSCULAR | Status: DC | PRN

## 2021-01-27 MED ORDER — BUPIVACAINE LIPOSOME 1.3 % IJ SUSP
INTRAMUSCULAR | Status: DC | PRN
  Administered 2021-01-27: 20 mL

## 2021-01-27 MED ORDER — POLYETHYLENE GLYCOL 3350 17 G PO PACK: 17.0000 g | PACK | Freq: Every day | ORAL | Status: DC | PRN

## 2021-01-27 MED ORDER — PHENYLEPHRINE HCL (PRESSORS) 10 MG/ML IV SOLN
INTRAVENOUS | Status: DC | PRN
Start: 2021-01-27 — End: 2021-01-27
  Administered 2021-01-27: 80 ug via INTRAVENOUS

## 2021-01-27 MED ORDER — TRANEXAMIC ACID-NACL 1000-0.7 MG/100ML-% IV SOLN
1000.0000 mg | INTRAVENOUS | Status: AC
  Administered 2021-01-27: 1000 mg via INTRAVENOUS
  Filled 2021-01-27: qty 100

## 2021-01-27 MED ORDER — CHLORHEXIDINE GLUCONATE 0.12 % MT SOLN: 15.0000 mL | Freq: Once | OROMUCOSAL | Status: DC

## 2021-01-27 MED ORDER — SODIUM CHLORIDE 0.9 % IR SOLN
Status: DC | PRN
  Administered 2021-01-27: 1000 mL

## 2021-01-27 MED ORDER — SCOPOLAMINE 1 MG/3DAYS TD PT72
MEDICATED_PATCH | TRANSDERMAL | Status: AC
  Filled 2021-01-27: qty 1

## 2021-01-27 MED ORDER — OXYCODONE HCL 5 MG PO TABS
ORAL_TABLET | ORAL | Status: AC
  Administered 2021-01-27: 5 mg via ORAL
  Filled 2021-01-27: qty 1

## 2021-01-27 MED ORDER — HYDROCODONE-ACETAMINOPHEN 7.5-325 MG PO TABS
ORAL_TABLET | ORAL | Status: AC
  Filled 2021-01-27: qty 2

## 2021-01-27 MED ORDER — BISACODYL 10 MG RE SUPP: 10.0000 mg | Freq: Every day | RECTAL | Status: DC | PRN

## 2021-01-27 MED ORDER — DEXAMETHASONE SODIUM PHOSPHATE 10 MG/ML IJ SOLN
8.0000 mg | Freq: Once | INTRAMUSCULAR | Status: AC
  Administered 2021-01-27: 10 mg via INTRAVENOUS

## 2021-01-27 MED ORDER — POVIDONE-IODINE 10 % EX SWAB: 2.0000 "application " | Freq: Once | CUTANEOUS | Status: DC

## 2021-01-27 MED ORDER — METHOCARBAMOL 500 MG PO TABS: 500.0000 mg | ORAL_TABLET | Freq: Four times a day (QID) | ORAL | Status: DC | PRN

## 2021-01-27 MED ORDER — HYDROCODONE-ACETAMINOPHEN 5-325 MG PO TABS
ORAL_TABLET | ORAL | Status: AC
  Filled 2021-01-27: qty 2

## 2021-01-27 MED ORDER — OXYCODONE HCL 5 MG/5ML PO SOLN: 5.0000 mg | Freq: Once | ORAL | Status: AC | PRN

## 2021-01-27 MED ORDER — HYDROCODONE-ACETAMINOPHEN 5-325 MG PO TABS
1.0000 | ORAL_TABLET | ORAL | Status: DC | PRN
  Administered 2021-01-28 (×2): 2 via ORAL
  Administered 2021-01-28 (×2): 1 via ORAL
  Filled 2021-01-27 (×2): qty 2
  Filled 2021-01-27 (×2): qty 1

## 2021-01-27 MED ORDER — PHENYLEPHRINE HCL (PRESSORS) 10 MG/ML IV SOLN
INTRAVENOUS | Status: AC
  Filled 2021-01-27: qty 1

## 2021-01-27 MED ORDER — PROPOFOL 500 MG/50ML IV EMUL
INTRAVENOUS | Status: DC | PRN
  Administered 2021-01-27: 75 ug/kg/min via INTRAVENOUS

## 2021-01-27 MED ORDER — FENTANYL CITRATE PF 50 MCG/ML IJ SOSY
25.0000 ug | PREFILLED_SYRINGE | INTRAMUSCULAR | Status: DC | PRN
  Administered 2021-01-27: 25 ug via INTRAVENOUS

## 2021-01-27 MED ORDER — METHOCARBAMOL 500 MG IVPB - SIMPLE MED
500.0000 mg | Freq: Four times a day (QID) | INTRAVENOUS | Status: DC | PRN
  Filled 2021-01-27: qty 50

## 2021-01-27 MED ORDER — CEFAZOLIN SODIUM-DEXTROSE 2-4 GM/100ML-% IV SOLN
2.0000 g | Freq: Four times a day (QID) | INTRAVENOUS | Status: AC
  Administered 2021-01-27 (×2): 2 g via INTRAVENOUS
  Filled 2021-01-27 (×2): qty 100

## 2021-01-27 MED ORDER — FENTANYL CITRATE PF 50 MCG/ML IJ SOSY
PREFILLED_SYRINGE | INTRAMUSCULAR | Status: AC
  Administered 2021-01-27: 25 ug via INTRAVENOUS
  Filled 2021-01-27: qty 1

## 2021-01-27 MED ORDER — BUPIVACAINE-EPINEPHRINE (PF) 0.25% -1:200000 IJ SOLN
INTRAMUSCULAR | Status: DC | PRN
  Administered 2021-01-27: 30 mL via PERINEURAL

## 2021-01-27 MED ORDER — MIDAZOLAM HCL 5 MG/5ML IJ SOLN
INTRAMUSCULAR | Status: DC | PRN
Start: 2021-01-27 — End: 2021-01-27
  Administered 2021-01-27: 2 mg via INTRAVENOUS

## 2021-01-27 MED ORDER — RIVAROXABAN 10 MG PO TABS
10.0000 mg | ORAL_TABLET | Freq: Every day | ORAL | Status: DC
  Administered 2021-01-28: 08:00:00 10 mg via ORAL
  Filled 2021-01-27: qty 1

## 2021-01-27 MED ORDER — HYDROCODONE-ACETAMINOPHEN 7.5-325 MG PO TABS
1.0000 | ORAL_TABLET | ORAL | Status: DC | PRN
  Administered 2021-01-27 (×2): 2 via ORAL
  Filled 2021-01-27 (×2): qty 2

## 2021-01-27 MED ORDER — CEFAZOLIN SODIUM-DEXTROSE 2-4 GM/100ML-% IV SOLN
2.0000 g | INTRAVENOUS | Status: AC
  Administered 2021-01-27: 2 g via INTRAVENOUS
  Filled 2021-01-27: qty 100

## 2021-01-27 MED ORDER — ONDANSETRON HCL 4 MG PO TABS: 4.0000 mg | ORAL_TABLET | Freq: Four times a day (QID) | ORAL | Status: DC | PRN

## 2021-01-27 MED ORDER — FLEET ENEMA 7-19 GM/118ML RE ENEM: 1.0000 | ENEMA | Freq: Once | RECTAL | Status: DC | PRN

## 2021-01-27 MED ORDER — METOCLOPRAMIDE HCL 5 MG/ML IJ SOLN
5.0000 mg | Freq: Three times a day (TID) | INTRAMUSCULAR | Status: DC | PRN
  Administered 2021-01-27 – 2021-01-28 (×2): 10 mg via INTRAVENOUS
  Filled 2021-01-27 (×2): qty 2

## 2021-01-27 MED ORDER — OXYCODONE HCL 5 MG PO TABS: 5.0000 mg | ORAL_TABLET | Freq: Once | ORAL | Status: AC | PRN

## 2021-01-27 MED ORDER — ONDANSETRON HCL 4 MG/2ML IJ SOLN
INTRAMUSCULAR | Status: DC | PRN
  Administered 2021-01-27: 4 mg via INTRAVENOUS

## 2021-01-27 MED ORDER — LACTATED RINGERS IV SOLN: INTRAVENOUS | Status: DC

## 2021-01-27 MED ORDER — PROPOFOL 500 MG/50ML IV EMUL
INTRAVENOUS | Status: DC | PRN
Start: 2021-01-27 — End: 2021-01-27
  Administered 2021-01-27 (×2): 30 mg via INTRAVENOUS

## 2021-01-27 MED ORDER — FENTANYL CITRATE (PF) 100 MCG/2ML IJ SOLN
INTRAMUSCULAR | Status: DC | PRN
  Administered 2021-01-27: 100 ug via INTRAVENOUS

## 2021-01-27 MED ORDER — DOCUSATE SODIUM 100 MG PO CAPS
100.0000 mg | ORAL_CAPSULE | Freq: Two times a day (BID) | ORAL | Status: DC
  Administered 2021-01-27 – 2021-01-28 (×2): 100 mg via ORAL
  Filled 2021-01-27 (×2): qty 1

## 2021-01-27 MED ORDER — METOCLOPRAMIDE HCL 5 MG PO TABS
5.0000 mg | ORAL_TABLET | Freq: Three times a day (TID) | ORAL | Status: DC | PRN
  Filled 2021-01-27: qty 2

## 2021-01-27 MED ORDER — ONDANSETRON HCL 4 MG/2ML IJ SOLN
4.0000 mg | Freq: Four times a day (QID) | INTRAMUSCULAR | Status: DC | PRN
  Administered 2021-01-27: 4 mg via INTRAVENOUS
  Filled 2021-01-27: qty 2

## 2021-01-27 MED ORDER — MIDAZOLAM HCL 2 MG/2ML IJ SOLN
INTRAMUSCULAR | Status: AC
  Filled 2021-01-27: qty 2

## 2021-01-27 MED ORDER — MENTHOL 3 MG MT LOZG: 1.0000 | LOZENGE | OROMUCOSAL | Status: DC | PRN

## 2021-01-27 MED ORDER — PROPOFOL 1000 MG/100ML IV EMUL
INTRAVENOUS | Status: AC
  Filled 2021-01-27: qty 100

## 2021-01-27 MED ORDER — ACETAMINOPHEN 325 MG PO TABS: 325.0000 mg | ORAL_TABLET | Freq: Four times a day (QID) | ORAL | Status: DC | PRN

## 2021-01-27 MED ORDER — SODIUM CHLORIDE 0.9 % IV SOLN: INTRAVENOUS | Status: DC

## 2021-01-27 MED ORDER — GABAPENTIN 300 MG PO CAPS
300.0000 mg | ORAL_CAPSULE | Freq: Three times a day (TID) | ORAL | Status: DC
  Administered 2021-01-27 – 2021-01-28 (×3): 300 mg via ORAL
  Filled 2021-01-27 (×3): qty 1

## 2021-01-27 MED ORDER — EPHEDRINE SULFATE 50 MG/ML IJ SOLN
INTRAMUSCULAR | Status: DC | PRN
  Administered 2021-01-27: 10 mg via INTRAVENOUS

## 2021-01-27 MED ORDER — MORPHINE SULFATE (PF) 2 MG/ML IV SOLN: 0.5000 mg | INTRAVENOUS | Status: DC | PRN

## 2021-01-27 MED ORDER — ACETAMINOPHEN 500 MG PO TABS: 1000.0000 mg | ORAL_TABLET | Freq: Once | ORAL | Status: DC

## 2021-01-27 MED ORDER — FENTANYL CITRATE (PF) 100 MCG/2ML IJ SOLN
INTRAMUSCULAR | Status: AC
  Filled 2021-01-27: qty 2

## 2021-01-27 MED ORDER — ACETAMINOPHEN 10 MG/ML IV SOLN
1000.0000 mg | Freq: Once | INTRAVENOUS | Status: AC
  Administered 2021-01-27: 1000 mg via INTRAVENOUS
  Filled 2021-01-27: qty 100

## 2021-01-27 MED ORDER — TRAMADOL HCL 50 MG PO TABS
50.0000 mg | ORAL_TABLET | Freq: Four times a day (QID) | ORAL | Status: DC | PRN
  Administered 2021-01-27 (×2): 100 mg via ORAL
  Filled 2021-01-27: qty 2

## 2021-01-27 MED ORDER — PROPOFOL 500 MG/50ML IV EMUL
INTRAVENOUS | Status: AC
  Filled 2021-01-27: qty 50

## 2021-01-27 MED ORDER — PHENYLEPHRINE HCL-NACL 20-0.9 MG/250ML-% IV SOLN
INTRAVENOUS | Status: DC | PRN
  Administered 2021-01-27: 40 ug/min via INTRAVENOUS

## 2021-01-27 MED ORDER — SODIUM CHLORIDE (PF) 0.9 % IJ SOLN
INTRAMUSCULAR | Status: DC | PRN
  Administered 2021-01-27: 60 mL

## 2021-01-27 MED ORDER — BUPIVACAINE LIPOSOME 1.3 % IJ SUSP: 20.0000 mL | Freq: Once | INTRAMUSCULAR | Status: DC

## 2021-01-27 MED ORDER — DEXAMETHASONE SODIUM PHOSPHATE 10 MG/ML IJ SOLN
10.0000 mg | Freq: Once | INTRAMUSCULAR | Status: AC
  Administered 2021-01-28: 08:00:00 10 mg via INTRAVENOUS
  Filled 2021-01-27: qty 1

## 2021-01-27 MED ORDER — SCOPOLAMINE 1 MG/3DAYS TD PT72
MEDICATED_PATCH | TRANSDERMAL | Status: DC | PRN
  Administered 2021-01-27: 1 via TRANSDERMAL

## 2021-01-27 MED ORDER — 0.9 % SODIUM CHLORIDE (POUR BTL) OPTIME
TOPICAL | Status: DC | PRN
  Administered 2021-01-27: 1000 mL

## 2021-01-27 MED ORDER — DIPHENHYDRAMINE HCL 12.5 MG/5ML PO ELIX
12.5000 mg | ORAL_SOLUTION | ORAL | Status: DC | PRN
  Administered 2021-01-27 – 2021-01-28 (×5): 25 mg via ORAL
  Filled 2021-01-27 (×7): qty 10

## 2021-01-27 SURGICAL SUPPLY — 72 items
AUG TIB ATTUNE LM/RL SZ3/4X10 (Joint) ×3 IMPLANT
AUG TIB ATTUNE RM/LL SZ3/4X10 (Joint) ×3 IMPLANT
AUGMENT TIB ATT LM/RL SZ3/4X10 (Joint) ×1 IMPLANT
AUGMENT TIB ATT RM/LL SZ3/4X10 (Joint) ×1 IMPLANT
BAG COUNTER SPONGE SURGICOUNT (BAG) IMPLANT
BAG DECANTER FOR FLEXI CONT (MISCELLANEOUS) ×3 IMPLANT
BAG SURGICOUNT SPONGE COUNTING (BAG)
BAG ZIPLOCK 12X15 (MISCELLANEOUS) IMPLANT
BLADE SAG 18X100X1.27 (BLADE) ×3 IMPLANT
BLADE SAW SGTL 11.0X1.19X90.0M (BLADE) ×3 IMPLANT
BLADE SURG SZ10 CARB STEEL (BLADE) ×6 IMPLANT
BNDG ELASTIC 6X10 VLCR STRL LF (GAUZE/BANDAGES/DRESSINGS) ×3 IMPLANT
BNDG ELASTIC 6X5.8 VLCR STR LF (GAUZE/BANDAGES/DRESSINGS) ×3 IMPLANT
BONE CEMENT GENTAMICIN (Cement) ×9 IMPLANT
CEMENT BONE GENTAMICIN 40 (Cement) ×3 IMPLANT
CLOSURE WOUND 1/2 X4 (GAUZE/BANDAGES/DRESSINGS) ×1
CLOTH BEACON ORANGE TIMEOUT ST (SAFETY) ×3 IMPLANT
COMP FEM ATTUNE CRS CEM RT SZ3 (Femur) ×3 IMPLANT
COMPONENT FEM ATN CR CEM RTSZ3 (Femur) ×1 IMPLANT
COVER SURGICAL LIGHT HANDLE (MISCELLANEOUS) ×3 IMPLANT
CUFF TOURN SGL QUICK 34 (TOURNIQUET CUFF) ×2
CUFF TRNQT CYL 34X4.125X (TOURNIQUET CUFF) ×1 IMPLANT
DECANTER SPIKE VIAL GLASS SM (MISCELLANEOUS) IMPLANT
DRAPE INCISE IOBAN 66X45 STRL (DRAPES) ×3 IMPLANT
DRAPE U-SHAPE 47X51 STRL (DRAPES) ×3 IMPLANT
DRSG ADAPTIC 3X8 NADH LF (GAUZE/BANDAGES/DRESSINGS) ×3 IMPLANT
DRSG AQUACEL AG ADV 3.5X10 (GAUZE/BANDAGES/DRESSINGS) ×3 IMPLANT
DRSG PAD ABDOMINAL 8X10 ST (GAUZE/BANDAGES/DRESSINGS) ×3 IMPLANT
DURAPREP 26ML APPLICATOR (WOUND CARE) ×3 IMPLANT
ELECT REM PT RETURN 15FT ADLT (MISCELLANEOUS) ×3 IMPLANT
EVACUATOR 1/8 PVC DRAIN (DRAIN) ×3 IMPLANT
GAUZE SPONGE 4X4 12PLY STRL (GAUZE/BANDAGES/DRESSINGS) ×3 IMPLANT
GLOVE SRG 8 PF TXTR STRL LF DI (GLOVE) ×1 IMPLANT
GLOVE SURG ENC MOIS LTX SZ6.5 (GLOVE) ×6 IMPLANT
GLOVE SURG ENC MOIS LTX SZ8 (GLOVE) ×6 IMPLANT
GLOVE SURG UNDER POLY LF SZ7 (GLOVE) ×3 IMPLANT
GLOVE SURG UNDER POLY LF SZ8 (GLOVE) ×2
GLOVE SURG UNDER POLY LF SZ8.5 (GLOVE) IMPLANT
GOWN STRL REUS W/TWL LRG LVL3 (GOWN DISPOSABLE) ×6 IMPLANT
HANDPIECE INTERPULSE COAX TIP (DISPOSABLE) ×2
HOLDER FOLEY CATH W/STRAP (MISCELLANEOUS) IMPLANT
IMMOBILIZER KNEE 20 (SOFTGOODS) ×3
IMMOBILIZER KNEE 20 THIGH 36 (SOFTGOODS) ×1 IMPLANT
INSERT TIB CMT ATTUNE RP SZ3 (Knees) ×3 IMPLANT
INSERT TIB CRS ATTUNE SZ3 14 (Insert) ×3 IMPLANT
KIT TURNOVER KIT A (KITS) IMPLANT
MANIFOLD NEPTUNE II (INSTRUMENTS) ×3 IMPLANT
NS IRRIG 1000ML POUR BTL (IV SOLUTION) ×3 IMPLANT
PACK TOTAL KNEE CUSTOM (KITS) ×3 IMPLANT
PADDING CAST ABS 6INX4YD NS (CAST SUPPLIES) ×2
PADDING CAST ABS COTTON 6X4 NS (CAST SUPPLIES) ×1 IMPLANT
PADDING CAST COTTON 6X4 STRL (CAST SUPPLIES) ×6 IMPLANT
PROTECTOR NERVE ULNAR (MISCELLANEOUS) ×3 IMPLANT
SET HNDPC FAN SPRY TIP SCT (DISPOSABLE) ×1 IMPLANT
SLEEVE KNEE ATTUNE 29MM (Knees) ×3 IMPLANT
STEM REV PRESSFIT 10X60 (Knees) ×3 IMPLANT
STEM STR ATTUNE PF 16X60 (Knees) ×3 IMPLANT
STRIP CLOSURE SKIN 1/2X4 (GAUZE/BANDAGES/DRESSINGS) ×2 IMPLANT
SUT MNCRL AB 4-0 PS2 18 (SUTURE) ×3 IMPLANT
SUT STRATAFIX 0 PDS 27 VIOLET (SUTURE) ×3
SUT VIC AB 2-0 CT1 27 (SUTURE) ×6
SUT VIC AB 2-0 CT1 TAPERPNT 27 (SUTURE) ×3 IMPLANT
SUTURE STRATFX 0 PDS 27 VIOLET (SUTURE) ×1 IMPLANT
SWAB COLLECTION DEVICE MRSA (MISCELLANEOUS) IMPLANT
SWAB CULTURE ESWAB REG 1ML (MISCELLANEOUS) IMPLANT
SYR 50ML LL SCALE MARK (SYRINGE) ×6 IMPLANT
TOWER CARTRIDGE SMART MIX (DISPOSABLE) ×3 IMPLANT
TRAY FOLEY MTR SLVR 16FR STAT (SET/KITS/TRAYS/PACK) ×3 IMPLANT
TUBE KAMVAC SUCTION (TUBING) IMPLANT
TUBE SUCTION HIGH CAP CLEAR NV (SUCTIONS) ×3 IMPLANT
WATER STERILE IRR 1000ML POUR (IV SOLUTION) ×3 IMPLANT
WRAP KNEE MAXI GEL POST OP (GAUZE/BANDAGES/DRESSINGS) ×3 IMPLANT

## 2021-01-27 NOTE — Interval H&P Note (Signed)
History and Physical Interval Note:  01/27/2021 8:12 AM  Deanna Dorsey  has presented today for surgery, with the diagnosis of painful right total knee arthroplasty.  The various methods of treatment have been discussed with the patient and family. After consideration of risks, benefits and other options for treatment, the patient has consented to  Procedure(s): TOTAL KNEE REVISION (Right) as a surgical intervention.  The patient's history has been reviewed, patient examined, no change in status, stable for surgery.  I have reviewed the patient's chart and labs.  Questions were answered to the patient's satisfaction.     Pilar Plate Carloyn Lahue

## 2021-01-27 NOTE — Op Note (Signed)
NAME: Deanna Dorsey, Deanna Dorsey MEDICAL RECORD NO: [id number] ACCOUNT NO: [id number] DATE OF BIRTH: 01-Feb-1956 FACILITY: [facility] LOCATION: [facility] PHYSICIAN: Dione Plover. Doni Bacha, MD  Operative Report   DATE OF PROCEDURE: 01/27/2021  PREOPERATIVE DIAGNOSIS:  Failed right total knee arthroplasty.  POSTOPERATIVE DIAGNOSIS:  Failed right total knee arthroplasty.  PROCEDURE:  Right total knee arthroplasty revision.  SURGEON:  Dione Plover. Nora Rooke, MD.  Terrence DupontFenton Foy, PA-C  ANESTHESIA:  Adductor canal block and spinal.  ESTIMATED BLOOD LOSS:  50 mL.  DRAINS:  None.  TOURNIQUET TIME:  Up 49 minutes at 300 mmHg, down 8 minutes, up additional 18 minutes at 300 mmHg.  COMPLICATIONS:  None.  CONDITION:  Stable to recovery.  BRIEF CLINICAL NOTE:  The patient is a 65 year old female with a painful right total knee arthroplasty with very limited range of motion.  She had a bone scan, which showed increased uptake around the tibia consistent with possible loosening.  Given her  pain and dysfunction she presents now for total knee arthroplasty revision.  DESCRIPTION OF PROCEDURE:  After successful administration of adductor canal block and spinal anesthetic a tourniquet was placed high on her right thigh and her right lower extremity was prepped and draped in the usual sterile fashion.  Extremity was  wrapped in Esmarch, knee flexed and the tourniquet inflated to 300 mmHg.  She can only flex to about 90.  Midline incision was made with a 10 blade through subcutaneous tissue to the extensor mechanism.  A fresh blade was used to make a medial  parapatellar arthrotomy.  Soft tissue over the proximal medial tibia is subperiosteally elevated to the joint line with a knife and into the semimembranosus bursa with a Cobb elevator.  Soft tissue laterally was elevated with attention being paid to  avoid the patellar tendon on the tibial tubercle.  I was able to evert the patella and flex the knee 90 degrees.  I  then removed the tibial polyethylene from the tibial tray.  Circumferential retractions placed around the tibia and then an oscillating  saw used to disrupt the interface between the tibial component and bone.  A tibial component was removed with essentially no bone loss.  Cement was removed from the proximal tibia and then reaming is performed up to 10 mm.  It was a good press fit at 10.   This was for a 60 mm press-fit stem.  We then felt that a size 3 was the most appropriate tibial component.  The proximal tibia was then prepared with the modular drill for the size 3 and we did the keel punch for the 3.  I also prepared for a 29 mm  sleeve.  We then addressed the femoral side.  I removed the femoral component by disrupting the interface between the component and bone using osteotomes.  It was removed with essentially no bone loss.  Size 3 is also most appropriate for the femur.  I reamed the  femoral canal up to 16 mm for placement of a 16 mm stem.  A 16 mm reamer was then left in for an intramedullary cutting guide.  We placed the distal femoral cutting block to remove about 2 mm off the distal femur.  Resection was made with an oscillating  saw.  The AP cutting block with a size 3 is placed.  Rotation was marked off the epicondylar axis and confirmed by creating a rectangular flexion gap at 90 degrees.  Anterior and posterior cuts essentially had no bone  removal.  We then placed the  intercondylar and chamfer block and minimal bone was taken off the chamfers.  The intercondylar cut was made.  The trial size 3 with a 60 x 16 mm stem is placed.  This has great fit on the femur, I realized that we were going to need to place 10 mm  augments on the tibial side in order to get the extension and flexion gaps to a point where we would not have an excessively thick polyethylene.  Those were placed and with a 14 mm insert, full extension was achieved with excellent varus, valgus, and  anterior, posterior  balance throughout full range of motion.  The tourniquet was then released a total time of 49 minutes and held down for 8 minutes while the components were assembled on the back table.  Once again, the component sizes on the femoral  size.  A size 3 Attune posterior stabilized femur with a 16 x 60 stem in the +2 position.  On the tibial side, it is a size 3 MBT revision tibia with a 60 x 10 press-fit stem, 10 mm augments medial and lateral and a 29 sleeve, which was a cementless  sleeve.  After 8 minutes, the tourniquet was reinflated to 300 mmHg.  The trials were then removed and a cut bone surfaces prepared with pulsatile lavage.  Two batches of gentamicin impregnated cement were mixed and once ready for implantation on the  tibial side, we just cemented the baseplate as we had a porous-coated sleeve and a press-fit stem.  It was excellent fit with the tibia.  On the femoral side, we just cemented distally because the stem was also press fit.  Both components were impacted  and all extruded cement was removed.  The trial 14 mm inserts were placed.  Knee held in full extension and again all extruded cement removed.  There was excellent stability throughout full range of motion.  When the cement was fully hardened and the  permanent 14 mm posterior stabilized rotating platform insert for the Attune knee is placed.  This was the stabilized insert for the revision.  Knee is reduced with outstanding stability throughout full range of motion.  20 mL of Exparel mixed with 60 mL  of saline are injected through the extensor mechanism, the periosteum of the femur and subcu.  The wound was copiously irrigated with saline solution and the arthrotomy closed with a running 0 Stratafix suture.  The flexion against gravity is  approximately 135 degrees.  Tourniquet was released for the tourniquet time of 18 minutes.  The minor bleeding was stopped with electrocautery.  Subcutaneous tissues were closed with interrupted 2-0  Vicryl and subcuticular running 4-0 Monocryl.  The  incision was cleaned and dried and Steri-Strips and a sterile dressing applied.  She was then awakened and transported to recovery in stable condition.  Note that a surgical assistant was of medical necessity for this procedure to do it in a safe and expeditious manner.  Surgical assistant is necessary for retraction of vital ligaments and neurovascular structures and for proper positioning of the limb  for removal of the old implant and for safe and accurate placement of the new implant.   PUS D: 01/27/2021 10:32:28 am T: 01/27/2021 11:21:00 am  JOB: [id number]/ [id number]

## 2021-01-27 NOTE — Progress Notes (Signed)
Orthopedic Tech Progress Note Patient Details:  Deanna Dorsey 1955-11-27 [id number]  CPM Right Knee CPM Right Knee: On Right Knee Flexion (Degrees): 40 Right Knee Extension (Degrees): 10  Post Interventions Patient Tolerated: Well Instructions Provided: Adjustment of device, Care of device CPM applied in PACU.  Vernona Rieger 01/27/2021, 12:26 PM

## 2021-01-27 NOTE — Discharge Instructions (Addendum)
 Frank Aluisio, MD Total Joint Specialist [facility] [place] [address] [place], [place] [address] [phone]  TOTAL KNEE REPLACEMENT POSTOPERATIVE DIRECTIONS    Knee Rehabilitation, Guidelines Following Surgery  Results after knee surgery are often greatly improved when you follow the exercise, range of motion and muscle strengthening exercises prescribed by your doctor. Safety measures are also important to protect the knee from further injury. If any of these exercises cause you to have increased pain or swelling in your knee joint, decrease the amount until you are comfortable again and slowly increase them. If you have problems or questions, call your caregiver or physical therapist for advice.   BLOOD CLOT PREVENTION Take a 10 mg Xarelto once a day for three weeks following surgery. Then take an 81 mg Aspirin once a day for three weeks. Then discontinue Aspirin. You may resume your vitamins/supplements once you have discontinued the Xarelto. Do not take any NSAIDs (Advil, Aleve, Ibuprofen, Meloxicam, etc.) until you have discontinued the Xarelto.   HOME CARE INSTRUCTIONS  Remove items at home which could result in a fall. This includes throw rugs or furniture in walking pathways.  ICE to the affected knee as much as tolerated. Icing helps control swelling. If the swelling is well controlled you will be more comfortable and rehab easier. Continue to use ice on the knee for pain and swelling from surgery. You may notice swelling that will progress down to the foot and ankle. This is normal after surgery. Elevate the leg when you are not up walking on it.    Continue to use the breathing machine which will help keep your temperature down. It is common for your temperature to cycle up and down following surgery, especially at night when you are not up moving around and exerting yourself. The breathing machine keeps your lungs expanded and your temperature  down. Do not place pillow under the operative knee, focus on keeping the knee straight while resting  DIET You may resume your previous home diet once you are discharged from the hospital.  DRESSING / WOUND CARE / SHOWERING Keep your bulky bandage on for 2 days. On the third post-operative day you may remove the Ace bandage and gauze. There is a waterproof adhesive bandage on your skin which will stay in place until your first follow-up appointment. Once you remove this you will not need to place another bandage You may begin showering 3 days following surgery, but do not submerge the incision under water.  ACTIVITY For the first 5 days, the key is rest and control of pain and swelling Do your home exercises twice a day starting on post-operative day 3. On the days you go to physical therapy, just do the home exercises once that day. You should rest, ice and elevate the leg for 50 minutes out of every hour. Get up and walk/stretch for 10 minutes per hour. After 5 days you can increase your activity slowly as tolerated. Walk with your walker as instructed. Use the walker until you are comfortable transitioning to a cane. Walk with the cane in the opposite hand of the operative leg. You may discontinue the cane once you are comfortable and walking steadily. Avoid periods of inactivity such as sitting longer than an hour when not asleep. This helps prevent blood clots.  You may discontinue the knee immobilizer once you are able to perform a straight leg raise while lying down. You may resume a sexual relationship in one month or   when given the OK by your doctor.  You may return to work once you are cleared by your doctor.  Do not drive a car for 6 weeks or until released by your surgeon.  Do not drive while taking narcotics.  TED HOSE STOCKINGS Wear the elastic stockings on both legs for three weeks following surgery during the day. You may remove them at night for sleeping.  WEIGHT  BEARING Weight bearing as tolerated with assist device (walker, cane, etc) as directed, use it as long as suggested by your surgeon or therapist, typically at least 4-6 weeks.  POSTOPERATIVE CONSTIPATION PROTOCOL Constipation - defined medically as fewer than three stools per week and severe constipation as less than one stool per week.  One of the most common issues patients have following surgery is constipation.  Even if you have a regular bowel pattern at home, your normal regimen is likely to be disrupted due to multiple reasons following surgery.  Combination of anesthesia, postoperative narcotics, change in appetite and fluid intake all can affect your bowels.  In order to avoid complications following surgery, here are some recommendations in order to help you during your recovery period.  Colace (docusate) - Pick up an over-the-counter form of Colace or another stool softener and take twice a day as long as you are requiring postoperative pain medications.  Take with a full glass of water daily.  If you experience loose stools or diarrhea, hold the colace until you stool forms back up. If your symptoms do not get better within 1 week or if they get worse, check with your doctor. Dulcolax (bisacodyl) - Pick up over-the-counter and take as directed by the product packaging as needed to assist with the movement of your bowels.  Take with a full glass of water.  Use this product as needed if not relieved by Colace only.  MiraLax (polyethylene glycol) - Pick up over-the-counter to have on hand. MiraLax is a solution that will increase the amount of water in your bowels to assist with bowel movements.  Take as directed and can mix with a glass of water, juice, soda, coffee, or tea. Take if you go more than two days without a movement. Do not use MiraLax more than once per day. Call your doctor if you are still constipated or irregular after using this medication for 7 days in a row.  If you continue  to have problems with postoperative constipation, please contact the office for further assistance and recommendations.  If you experience "the worst abdominal pain ever" or develop nausea or vomiting, please contact the office immediatly for further recommendations for treatment.  ITCHING If you experience itching with your medications, try taking only a single pain pill, or even half a pain pill at a time.  You can also use Benadryl over the counter for itching or also to help with sleep.   MEDICATIONS See your medication summary on the "After Visit Summary" that the nursing staff will review with you prior to discharge.  You may have some home medications which will be placed on hold until you complete the course of blood thinner medication.  It is important for you to complete the blood thinner medication as prescribed by your surgeon.  Continue your approved medications as instructed at time of discharge.  PRECAUTIONS If you experience chest pain or shortness of breath - call 911 immediately for transfer to the hospital emergency department.  If you develop a fever greater that 101 F, purulent   drainage from wound, increased redness or drainage from wound, foul odor from the wound/dressing, or calf pain - CONTACT YOUR SURGEON.                                                   FOLLOW-UP APPOINTMENTS Make sure you keep all of your appointments after your operation with your surgeon and caregivers. You should call the office at the above phone number and make an appointment for approximately two weeks after the date of your surgery or on the date instructed by your surgeon outlined in the "After Visit Summary".  RANGE OF MOTION AND STRENGTHENING EXERCISES  Rehabilitation of the knee is important following a knee injury or an operation. After just a few days of immobilization, the muscles of the thigh which control the knee become weakened and shrink (atrophy). Knee exercises are designed to build up  the tone and strength of the thigh muscles and to improve knee motion. Often times heat used for twenty to thirty minutes before working out will loosen up your tissues and help with improving the range of motion but do not use heat for the first two weeks following surgery. These exercises can be done on a training (exercise) mat, on the floor, on a table or on a bed. Use what ever works the best and is most comfortable for you Knee exercises include:  Leg Lifts - While your knee is still immobilized in a splint or cast, you can do straight leg raises. Lift the leg to 60 degrees, hold for 3 sec, and slowly lower the leg. Repeat 10-20 times 2-3 times daily. Perform this exercise against resistance later as your knee gets better.  Quad and Hamstring Sets - Tighten up the muscle on the front of the thigh (Quad) and hold for 5-10 sec. Repeat this 10-20 times hourly. Hamstring sets are done by pushing the foot backward against an object and holding for 5-10 sec. Repeat as with quad sets.  Leg Slides: Lying on your back, slowly slide your foot toward your buttocks, bending your knee up off the floor (only go as far as is comfortable). Then slowly slide your foot back down until your leg is flat on the floor again. Angel Wings: Lying on your back spread your legs to the side as far apart as you can without causing discomfort.  A rehabilitation program following serious knee injuries can speed recovery and prevent re-injury in the future due to weakened muscles. Contact your doctor or a physical therapist for more information on knee rehabilitation.   POST-OPERATIVE OPIOID TAPER INSTRUCTIONS: It is important to wean off of your opioid medication as soon as possible. If you do not need pain medication after your surgery it is ok to stop day one. Opioids include: Codeine, Hydrocodone(Norco, Vicodin), Oxycodone(Percocet, oxycontin) and hydromorphone amongst others.  Long term and even short term use of opiods can  cause: Increased pain response Dependence Constipation Depression Respiratory depression And more.  Withdrawal symptoms can include Flu like symptoms Nausea, vomiting And more Techniques to manage these symptoms Hydrate well Eat regular healthy meals Stay active Use relaxation techniques(deep breathing, meditating, yoga) Do Not substitute Alcohol to help with tapering If you have been on opioids for less than two weeks and do not have pain than it is ok to stop all together.  Plan to   wean off of opioids This plan should start within one week post op of your joint replacement. Maintain the same interval or time between taking each dose and first decrease the dose.  Cut the total daily intake of opioids by one tablet each day Next start to increase the time between doses. The last dose that should be eliminated is the evening dose.   IF YOU ARE TRANSFERRED TO A SKILLED REHAB FACILITY If the patient is transferred to a skilled rehab facility following release from the hospital, a list of the current medications will be sent to the facility for the patient to continue.  When discharged from the skilled rehab facility, please have the facility set up the patient's Home Health Physical Therapy prior to being released. Also, the skilled facility will be responsible for providing the patient with their medications at time of release from the facility to include their pain medication, the muscle relaxants, and their blood thinner medication. If the patient is still at the rehab facility at time of the two week follow up appointment, the skilled rehab facility will also need to assist the patient in arranging follow up appointment in our office and any transportation needs.  MAKE SURE YOU:  Understand these instructions.  Get help right away if you are not doing well or get worse.   DENTAL ANTIBIOTICS:  In most cases prophylactic antibiotics for Dental procdeures after total joint surgery are  not necessary.  Exceptions are as follows:  1. History of prior total joint infection  2. Severely immunocompromised (Organ Transplant, cancer chemotherapy, Rheumatoid biologic meds such as Humera)  3. Poorly controlled diabetes (A1C &gt; 8.0, blood glucose over 200)  If you have one of these conditions, contact your surgeon for an antibiotic prescription, prior to your dental procedure.    Pick up stool softner and laxative for home use following surgery while on pain medications. Do not submerge incision under water. Please use good hand washing techniques while changing dressing each day. May shower starting three days after surgery. Please use a clean towel to pat the incision dry following showers. Continue to use ice for pain and swelling after surgery. Do not use any lotions or creams on the incision until instructed by your surgeon.  Information on my medicine - XARELTO (Rivaroxaban)  Why was Xarelto prescribed for you? Xarelto was prescribed for you to reduce the risk of blood clots forming after orthopedic surgery. The medical term for these abnormal blood clots is venous thromboembolism (VTE).  What do you need to know about xarelto ? Take your Xarelto ONCE DAILY at the same time every day. You may take it either with or without food.  If you have difficulty swallowing the tablet whole, you may crush it and mix in applesauce just prior to taking your dose.  Take Xarelto exactly as prescribed by your doctor and DO NOT stop taking Xarelto without talking to the doctor who prescribed the medication.  Stopping without other VTE prevention medication to take the place of Xarelto may increase your risk of developing a clot.  After discharge, you should have regular check-up appointments with your healthcare provider that is prescribing your Xarelto.    What do you do if you miss a dose? If you miss a dose, take it as soon as you remember on the same day then  continue your regularly scheduled once daily regimen the next day. Do not take two doses of Xarelto on the same day.   Important   Safety Information A possible side effect of Xarelto is bleeding. You should call your healthcare provider right away if you experience any of the following: Bleeding from an injury or your nose that does not stop. Unusual colored urine (red or dark brown) or unusual colored stools (red or black). Unusual bruising for unknown reasons. A serious fall or if you hit your head (even if there is no bleeding).  Some medicines may interact with Xarelto and might increase your risk of bleeding while on Xarelto. To help avoid this, consult your healthcare provider or pharmacist prior to using any new prescription or non-prescription medications, including herbals, vitamins, non-steroidal anti-inflammatory drugs (NSAIDs) and supplements.  This website has more information on Xarelto: www.xarelto.com.    

## 2021-01-27 NOTE — Anesthesia Postprocedure Evaluation (Signed)
Anesthesia Post Note  Patient: Deanna Dorsey  Procedure(s) Performed: TOTAL KNEE REVISION (Right: Knee)     Patient location during evaluation: PACU Anesthesia Type: Spinal Level of consciousness: awake and alert Pain management: pain level controlled Vital Signs Assessment: post-procedure vital signs reviewed and stable Respiratory status: spontaneous breathing and respiratory function stable Cardiovascular status: blood pressure returned to baseline and stable Postop Assessment: spinal receding and no apparent nausea or vomiting Anesthetic complications: no   No notable events documented.  Last Vitals:  Vitals:   01/27/21 1315 01/27/21 1344  BP: (!) 142/77 (!) 155/85  Pulse: 81 90  Resp: 19 18  Temp:  36.7 C  SpO2: 97% 96%                  Audry Pili

## 2021-01-27 NOTE — Transfer of Care (Signed)
Immediate Anesthesia Transfer of Care Note  Patient: Jullie Arps  Procedure(s) Performed: TOTAL KNEE REVISION (Right: Knee)  Patient Location: PACU  Anesthesia Type:Spinal  Level of Consciousness: awake, alert  and oriented  Airway & Oxygen Therapy: Patient Spontanous Breathing and Patient connected to face mask oxygen  Post-op Assessment: Report given to RN and Post -op Vital signs reviewed and stable  Post vital signs: Reviewed and stable  Last Vitals:  Vitals Value Taken Time  BP 124/69 01/27/21 1100  Temp    Pulse 88 01/27/21 1103  Resp 15 01/27/21 1103  SpO2 100 % 01/27/21 1103  Vitals shown include unvalidated device data.  Last Pain:  Vitals:   01/27/21 0713  TempSrc:   PainSc: 0-No pain      Patients Stated Pain Goal: 4 (12/50/87 1994)  Complications: No notable events documented.

## 2021-01-27 NOTE — Progress Notes (Signed)
Orthopedic Tech Progress Note Patient Details:  Deanna Dorsey 1955/07/15 [id number]  CPM Left Knee CPM Left Knee: Off CPM Right Knee CPM Right Knee: On Right Knee Flexion (Degrees): 40 Right Knee Extension (Degrees): 10  Post Interventions Patient Tolerated: Well Instructions Provided: Adjustment of device, Care of device  [place] 01/27/2021, 3:01 PM

## 2021-01-27 NOTE — Anesthesia Procedure Notes (Signed)
Spinal  Patient location during procedure: OR Start time: 01/27/2021 8:31 AM End time: 01/27/2021 8:35 AM Reason for block: surgical anesthesia Staffing Performed: anesthesiologist  Anesthesiologist: Audry Pili, MD Preanesthetic Checklist Completed: patient identified, IV checked, risks and benefits discussed, surgical consent, monitors and equipment checked, pre-op evaluation and timeout performed Spinal Block Patient position: sitting Prep: DuraPrep Patient monitoring: heart rate, cardiac monitor, continuous pulse ox and blood pressure Approach: midline Location: L3-4 Injection technique: single-shot Needle Needle type: Pencan  Needle gauge: 24 G Additional Notes Consent was obtained prior to the procedure with all questions answered and concerns addressed. Risks including, but not limited to, bleeding, infection, nerve damage, paralysis, failed block, inadequate analgesia, allergic reaction, high spinal, itching, and headache were discussed and the patient wished to proceed. Functioning IV was confirmed and monitors were applied. Sterile prep and drape, including hand hygiene, mask, and sterile gloves were used. The patient was positioned and the spine was prepped. The skin was anesthetized with lidocaine. Free flow of clear CSF was obtained prior to injecting local anesthetic into the CSF. The spinal needle aspirated freely following injection. The needle was carefully withdrawn. The patient tolerated the procedure well.   Renold Don, MD

## 2021-01-27 NOTE — Brief Op Note (Signed)
01/27/2021  10:23 AM  PATIENT:  Deanna Dorsey  65 y.o. female  PRE-OPERATIVE DIAGNOSIS:  painful right total knee arthroplasty  POST-OPERATIVE DIAGNOSIS:  painful right total knee arthroplasty  PROCEDURE:  Procedure(s): TOTAL KNEE REVISION (Right)  SURGEON:  Surgeon(s) and Role:    Gaynelle Arabian, MD - Primary  PHYSICIAN ASSISTANT:   ASSISTANTS: Fenton Foy, PA-C   ANESTHESIA:    Adductor canal block and spinal  EBL:  50 mL   BLOOD ADMINISTERED:none  DRAINS: none   LOCAL MEDICATIONS USED:  OTHER Exparel  COUNTS:  YES  TOURNIQUET:   Total Tourniquet Time Documented: Thigh (Right) - 49 minutes Thigh (Right) - 18 minutes Total: Thigh (Right) - 67 minutes   DICTATION: .Other Dictation: Dictation Number [id number]  PLAN OF CARE: Admit to inpatient   PATIENT DISPOSITION:  PACU - hemodynamically stable.

## 2021-01-27 NOTE — Evaluation (Signed)
Physical Therapy Evaluation Patient Details Name: Deanna Dorsey MRN: [id number] DOB: 01-30-1956 Today's Date: 01/27/2021  History of Present Illness  Pt is 64 yo female admitted on 01/27/21 for R TKA revision. Pt with hx of arthritis , L TKA 09/2020, R TKA 2019, back surgery.  Clinical Impression  Pt is s/p TKA resulting in the deficits listed below (see PT Problem List). At baseline , pt lives alone and is independent.  She will have her granddaughter to assist at d/c and has DME.  Today, pt requiring min guard for safety but was able to ambulate 80'.  Pt with good pain control and quad activation, but lacking terminal extension.  Encouraged to rest with leg straight and perform quad sets.  Expected to progress well. Pt will benefit from skilled PT to increase their independence and safety with mobility to allow discharge to the venue listed below.         Recommendations for follow up therapy are one component of a multi-disciplinary discharge planning process, led by the attending physician.  Recommendations may be updated based on patient status, additional functional criteria and insurance authorization.  Follow Up Recommendations Follow physician's recommendations for discharge plan and follow up therapies    Assistance Recommended at Discharge Frequent or constant Supervision/Assistance  Functional Status Assessment Patient has had a recent decline in their functional status and demonstrates the ability to make significant improvements in function in a reasonable and predictable amount of time.  Equipment Recommendations  None recommended by PT    Recommendations for Other Services       Precautions / Restrictions Precautions Precautions: Fall Restrictions Weight Bearing Restrictions: Yes RLE Weight Bearing: Weight bearing as tolerated      Mobility  Bed Mobility Overal bed mobility: Needs Assistance Bed Mobility: Supine to Sit;Sit to Supine     Supine to sit:  Supervision Sit to supine: Supervision   General bed mobility comments: Pt self assisting R LE with L LE without cues    Transfers Overall transfer level: Needs assistance Equipment used: Rolling walker (2 wheels) Transfers: Sit to/from Stand Sit to Stand: Min guard           General transfer comment: Min cues for R LE management    Ambulation/Gait Ambulation/Gait assistance: Min guard Gait Distance (Feet): 80 Feet Assistive device: Rolling walker (2 wheels) Gait Pattern/deviations: Step-to pattern;Decreased stride length;Decreased weight shift to right Gait velocity: decreased     General Gait Details: Min cues for sequencing  Stairs            Wheelchair Mobility    Modified Rankin (Stroke Patients Only)       Balance Overall balance assessment: Needs assistance Sitting-balance support: No upper extremity supported Sitting balance-Leahy Scale: Normal     Standing balance support: Bilateral upper extremity supported;No upper extremity supported Standing balance-Leahy Scale: Fair Standing balance comment: RW to ambulate but could static stand without ad                             Pertinent Vitals/Pain Pain Assessment: 0-10 Pain Score: 5  Pain Location: R knee Pain Descriptors / Indicators: Discomfort;Sore Pain Intervention(s): Limited activity within patient's tolerance;Monitored during session;Repositioned;Ice applied    Home Living Family/patient expects to be discharged to:: Private residence Living Arrangements: Alone Available Help at Discharge: Family;Available 24 hours/day (granddaughter assisting) Type of Home: Other(Comment) (condo) Home Access: Stairs to enter Entrance Stairs-Rails: None Entrance Stairs-Number of Steps:  1 large step to enter - reports pulling on door   Home Layout: One level Home Equipment: Shower seat;Shower seat - built in;Grab bars - Statistician (2 wheels);BSC/3in1;Cane - single point       Prior Function Prior Level of Function : Independent/Modified Independent             Mobility Comments: Working in Personal assistant; independent with community ambulation without AD ADLs Comments: Independent     Hand Dominance        Extremity/Trunk Assessment   Upper Extremity Assessment Upper Extremity Assessment: Overall WFL for tasks assessed    Lower Extremity Assessment Lower Extremity Assessment: LLE deficits/detail;RLE deficits/detail RLE Deficits / Details: ROM: hip and ankle WFL; knee 10 to 70 degrees; MMT: 5/5 ankle, 3/5 hip and knee RLE Sensation: WNL LLE Deficits / Details: ROM WFL; MMT 5/5 LLE Sensation: WNL    Cervical / Trunk Assessment Cervical / Trunk Assessment: Normal  Communication   Communication: No difficulties  Cognition Arousal/Alertness: Awake/alert Behavior During Therapy: WFL for tasks assessed/performed Overall Cognitive Status: Within Functional Limits for tasks assessed                                          General Comments General comments (skin integrity, edema, etc.): Pt with nausea post session    Exercises     Assessment/Plan    PT Assessment Patient needs continued PT services  PT Problem List Decreased strength;Decreased mobility;Decreased range of motion;Decreased activity tolerance;Decreased balance;Decreased knowledge of use of DME;Pain       PT Treatment Interventions DME instruction;Therapeutic activities;Modalities;Gait training;Therapeutic exercise;Patient/family education;Stair training;Balance training;Functional mobility training    PT Goals (Current goals can be found in the Care Plan section)  Acute Rehab PT Goals Patient Stated Goal: return home PT Goal Formulation: With patient Time For Goal Achievement: 02/10/21 Potential to Achieve Goals: Good    Frequency 7X/week   Barriers to discharge        Co-evaluation               AM-PAC PT "6 Clicks" Mobility  Outcome Measure  Help needed turning from your back to your side while in a flat bed without using bedrails?: A Little Help needed moving from lying on your back to sitting on the side of a flat bed without using bedrails?: A Little Help needed moving to and from a bed to a chair (including a wheelchair)?: A Little Help needed standing up from a chair using your arms (e.g., wheelchair or bedside chair)?: A Little Help needed to walk in hospital room?: A Little Help needed climbing 3-5 steps with a railing? : A Little 6 Click Score: 18    End of Session Equipment Utilized During Treatment: Gait belt Activity Tolerance: Patient tolerated treatment well Patient left: in bed;with call bell/phone within reach;with bed alarm set Nurse Communication: Mobility status;Other (comment) (request nausea med) PT Visit Diagnosis: Other abnormalities of gait and mobility (R26.89);Muscle weakness (generalized) (M62.81)    Time: 2683-4196 PT Time Calculation (min) (ACUTE ONLY): 25 min   Charges:   PT Evaluation $PT Eval Low Complexity: 1 Low PT Treatments $Gait Training: 8-22 mins        Abran Richard, PT Acute Rehab Services Pager [phone] [facility] 01/27/2021, 3:33 PM

## 2021-01-27 NOTE — Anesthesia Procedure Notes (Signed)
Anesthesia Regional Block: Adductor canal block   Pre-Anesthetic Checklist: , timeout performed,  Correct Patient, Correct Site, Correct Laterality,  Correct Procedure, Correct Position, site marked,  Risks and benefits discussed,  Surgical consent,  Pre-op evaluation,  At surgeon's request and post-op pain management  Laterality: Right  Prep: chloraprep       Needles:  Injection technique: Single-shot  Needle Type: Echogenic Needle     Needle Length: 10cm  Needle Gauge: 21     Additional Needles:   Narrative:  Start time: 01/27/2021 7:45 AM End time: 01/27/2021 7:48 AM Injection made incrementally with aspirations every 5 mL.  Performed by: Personally  Anesthesiologist: Audry Pili, MD  Additional Notes: No pain on injection. No increased resistance to injection. Injection made in 5cc increments. Good needle visualization. Patient tolerated the procedure well.

## 2021-01-28 ENCOUNTER — Other Ambulatory Visit (HOSPITAL_COMMUNITY): Payer: Self-pay

## 2021-01-28 ENCOUNTER — Encounter (HOSPITAL_COMMUNITY): Payer: Self-pay | Admitting: Orthopedic Surgery

## 2021-01-28 LAB — BASIC METABOLIC PANEL
Anion gap: 4 — ABNORMAL LOW (ref 5–15)
BUN: 7 mg/dL — ABNORMAL LOW (ref 8–23)
CO2: 26 mmol/L (ref 22–32)
Calcium: 8.5 mg/dL — ABNORMAL LOW (ref 8.9–10.3)
Chloride: 106 mmol/L (ref 98–111)
Creatinine, Ser: 0.48 mg/dL (ref 0.44–1.00)
GFR, Estimated: >60 mL/min (ref >60)
Glucose, Bld: 172 mg/dL — ABNORMAL HIGH (ref 70–99)
Potassium: 4.8 mmol/L (ref 3.5–5.1)
Sodium: 136 mmol/L (ref 135–145)

## 2021-01-28 LAB — CBC
HCT: 32.8 % — ABNORMAL LOW (ref 36.0–46.0)
Hemoglobin: 10.8 g/dL — ABNORMAL LOW (ref 12.0–15.0)
MCH: 31.3 pg (ref 26.0–34.0)
MCHC: 32.9 g/dL (ref 30.0–36.0)
MCV: 95.1 fL (ref 80.0–100.0)
Platelets: 229 10*3/uL (ref 150–400)
RBC: 3.45 MIL/uL — ABNORMAL LOW (ref 3.87–5.11)
RDW: 13.1 % (ref 11.5–15.5)
WBC: 11.4 10*3/uL — ABNORMAL HIGH (ref 4.0–10.5)
nRBC: 0 % (ref 0.0–0.2)

## 2021-01-28 MED ORDER — RIVAROXABAN 10 MG PO TABS
ORAL_TABLET | ORAL | 0 refills | Status: DC
  Filled 2021-01-28: qty 21, 21d supply, fill #0

## 2021-01-28 MED ORDER — HYDROCODONE-ACETAMINOPHEN 5-325 MG PO TABS
1.0000 | ORAL_TABLET | Freq: Four times a day (QID) | ORAL | 0 refills | Status: DC | PRN
  Filled 2021-01-28: qty 42, 6d supply, fill #0

## 2021-01-28 MED ORDER — GABAPENTIN 300 MG PO CAPS
ORAL_CAPSULE | ORAL | 0 refills | Status: DC
  Filled 2021-01-28: qty 84, 42d supply, fill #0

## 2021-01-28 MED ORDER — TRAMADOL HCL 50 MG PO TABS
50.0000 mg | ORAL_TABLET | Freq: Four times a day (QID) | ORAL | 0 refills | Status: DC | PRN
  Filled 2021-01-28: qty 40, 5d supply, fill #0

## 2021-01-28 MED ORDER — METHOCARBAMOL 500 MG PO TABS
500.0000 mg | ORAL_TABLET | Freq: Four times a day (QID) | ORAL | 0 refills | Status: DC | PRN
  Filled 2021-01-28: qty 40, 10d supply, fill #0

## 2021-01-28 NOTE — Plan of Care (Signed)
  Problem: Activity: Goal: Ability to avoid complications of mobility impairment will improve Outcome: Not Progressing   Problem: Pain Management: Goal: Pain level will decrease with appropriate interventions Outcome: Not Progressing   Problem: Skin Integrity: Goal: Will show signs of wound healing Outcome: Not Progressing

## 2021-01-28 NOTE — Progress Notes (Signed)
Subjective: 1 Day Post-Op Procedure(s) (LRB): TOTAL KNEE REVISION (Right) Patient reports pain as mild.   Patient seen in rounds by Dr. Wynelle Link. Patient is well, and has had no acute complaints or problems. Denies SOB, chest pain, or calf pain. No acute overnight events. Ambulated 80 feet with therapy yesterday. Will continue therapy today. Foley pulled this am.    Objective: Vital signs in last 24 hours: Temp:  [97.8 F (36.6 C)-98.4 F (36.9 C)] 97.9 F (36.6 C) (11/17 0545) Pulse Rate:  [65-90] 77 (11/17 0545) Resp:  [14-19] 16 (11/17 0545) BP: (112-155)/(56-85) 136/65 (11/17 0545) SpO2:  [95 %-100 %] 100 % (11/17 0545)  Intake/Output from previous day:  Intake/Output Summary (Last 24 hours) at 01/28/2021 0753 Last data filed at 01/28/2021 0600 Gross per 24 hour  Intake 4748.08 ml  Output 2550 ml  Net 2198.08 ml     Intake/Output this shift: No intake/output data recorded.  Labs: Recent Labs    01/28/21 0307  HGB 10.8*   Recent Labs    01/28/21 0307  WBC 11.4*  RBC 3.45*  HCT 32.8*  PLT 229   Recent Labs    01/28/21 0307  NA 136  K 4.8  CL 106  CO2 26  BUN 7*  CREATININE 0.48  GLUCOSE 172*  CALCIUM 8.5*   No results for input(s): LABPT, INR in the last 72 hours.  Exam: General - Patient is Alert and Oriented Extremity - Neurologically intact Neurovascular intact Intact pulses distally Dorsiflexion/Plantar flexion intact Dressing - dressing C/D/I Motor Function - intact, moving foot and toes well on exam.   Past Medical History:  Diagnosis Date   Arthritis    Asthma 07/17/2020   vaccine triggered,  allergy triggered asthma   Family history of adverse reaction to anesthesia    sister slow to wake up   History of kidney stones    1982   Pneumonia    PONV (postoperative nausea and vomiting)    Skin cancer    legs and face   Sleep apnea 2003   had surgical fix    Assessment/Plan: 1 Day Post-Op Procedure(s) (LRB): TOTAL KNEE  REVISION (Right) Principal Problem:   Failed total knee arthroplasty ([place]) Active Problems:   Status post revision of total knee replacement, right  Estimated body mass index is 24.45 kg/m as calculated from the following:   Height as of this encounter: 5\' 3"  (1.6 m).   Weight as of this encounter: 62.6 kg. Up with therapy   Patient's anticipated LOS is less than 2 midnights, meeting these requirements: - Lives within 1 hour of care - Has a competent adult at home to recover with post-op - NO history of  - Chronic pain requiring opioids  - Diabetes  - Coronary Artery Disease  - Heart failure  - Heart attack  - Stroke  - DVT/VTE  - Cardiac arrhythmia  - Respiratory Failure/COPD  - Renal failure  - Anemia  - Advanced Liver disease     DVT Prophylaxis - Xarelto and TED hose Weight bearing as tolerated. Continue therapy.  Plan for two sessions with PT this morning, and if meeting goals, will plan for discharge this afternoon.   Patient to follow up in two weeks with Dr. Wynelle Link in clinic.   The PDMP database was reviewed today prior to any opioid medications being prescribed to this patient..   Plan is to go Home after hospital stay.  Fenton Foy, MBA, PA-C Orthopedic Surgery 01/28/2021, 7:53 AM

## 2021-01-28 NOTE — Progress Notes (Signed)
Physical Therapy Treatment Patient Details Name: Deanna Dorsey MRN: [id number] DOB: 04-Jan-1956 Today's Date: 01/28/2021   History of Present Illness Pt is 65 yo female admitted on 01/27/21 for R TKA revision. Pt with hx of arthritis , L TKA 09/2020, R TKA 2019, back surgery.    PT Comments    Pt is progressing well with mobility and is ready to DC home from a PT standpoint. She ambulated 125' and demonstrates good understanding of HEP.    Recommendations for follow up therapy are one component of a multi-disciplinary discharge planning process, led by the attending physician.  Recommendations may be updated based on patient status, additional functional criteria and insurance authorization.  Follow Up Recommendations  Follow physician's recommendations for discharge plan and follow up therapies     Assistance Recommended at Discharge Frequent or constant Supervision/Assistance  Equipment Recommendations  None recommended by PT    Recommendations for Other Services       Precautions / Restrictions Precautions Precautions: Fall;Knee Precaution Booklet Issued: Yes (comment) Precaution Comments: reviewed no pillow under knee Restrictions Weight Bearing Restrictions: No RLE Weight Bearing: Weight bearing as tolerated     Mobility  Bed Mobility Overal bed mobility: Modified Independent Bed Mobility: Supine to Sit     Supine to sit: Modified independent (Device/Increase time);HOB elevated     General bed mobility comments: Pt self assisting R LE with L LE without cues    Transfers Overall transfer level: Needs assistance Equipment used: Rolling walker (2 wheels) Transfers: Sit to/from Stand Sit to Stand: Supervision           General transfer comment: supervision for safety    Ambulation/Gait Ambulation/Gait assistance: Supervision Gait Distance (Feet): 125 Feet               Stairs  Pt declined stair training, she recalls the technique from prior knee  surgeries.            Wheelchair Mobility    Modified Rankin (Stroke Patients Only)       Balance Overall balance assessment: Needs assistance Sitting-balance support: No upper extremity supported Sitting balance-Leahy Scale: Normal     Standing balance support: Bilateral upper extremity supported;No upper extremity supported Standing balance-Leahy Scale: Fair Standing balance comment: RW to ambulate but could static stand without ad                            Cognition Arousal/Alertness: Awake/alert Behavior During Therapy: WFL for tasks assessed/performed Overall Cognitive Status: Within Functional Limits for tasks assessed                                          Exercises Total Joint Exercises Ankle Circles/Pumps: AROM;Both;10 reps;Supine Quad Sets: AROM;Both;5 reps;Supine Short Arc Quad: AROM;Right;10 reps;Supine Heel Slides: AAROM;Right;10 reps;Supine Hip ABduction/ADduction: AROM;Right;10 reps;Supine Straight Leg Raises: AROM;Right;5 reps;Supine Long Arc Quad: AROM;Right;5 reps;Seated Knee Flexion: AAROM;Right;10 reps;Seated Goniometric ROM: 5-60* AAROM R knee    General Comments        Pertinent Vitals/Pain Pain Score: 7  Pain Location: R knee Pain Descriptors / Indicators: Discomfort;Sore Pain Intervention(s): Limited activity within patient's tolerance;Monitored during session;Premedicated before session;Ice applied    Home Living                          Prior Function  PT Goals (current goals can now be found in the care plan section) Acute Rehab PT Goals Patient Stated Goal: return home PT Goal Formulation: With patient Time For Goal Achievement: 02/10/21 Potential to Achieve Goals: Good Progress towards PT goals: Progressing toward goals    Frequency    7X/week      PT Plan Current plan remains appropriate    Co-evaluation              AM-PAC PT "6 Clicks" Mobility    Outcome Measure  Help needed turning from your back to your side while in a flat bed without using bedrails?: A Little Help needed moving from lying on your back to sitting on the side of a flat bed without using bedrails?: A Little Help needed moving to and from a bed to a chair (including a wheelchair)?: A Little Help needed standing up from a chair using your arms (e.g., wheelchair or bedside chair)?: A Little Help needed to walk in hospital room?: A Little Help needed climbing 3-5 steps with a railing? : A Little 6 Click Score: 18    End of Session Equipment Utilized During Treatment: Gait belt Activity Tolerance: Patient tolerated treatment well Patient left: in bed;with call bell/phone within reach;with bed alarm set (nauseous sitting in recliner so assisted her to bed) Nurse Communication: Mobility status PT Visit Diagnosis: Other abnormalities of gait and mobility (R26.89);Muscle weakness (generalized) (M62.81)     Time: 3383-2919 PT Time Calculation (min) (ACUTE ONLY): 36 min  Charges:  $Gait Training: 8-22 mins $Therapeutic Exercise: 8-22 mins                     Blondell Reveal Kistler PT 01/28/2021  Acute Rehabilitation Services Pager [phone] Office [phone]

## 2021-01-28 NOTE — TOC Transition Note (Signed)
Transition of Care [facility]) - CM/SW Discharge Note   Patient Details  Name: Deanna Dorsey MRN: [id number] Date of Birth: [birth year]  [facility] of Care [facility] CM/SW Contact:  Lennart Pall, LCSW Phone Number: 01/28/2021, 1:58 PM   Clinical Narrative:     Met with pt and confirming she has all needed DME.  Plan for OPPT at [place]. No TOC needs.  Final next level of care: OP Rehab Barriers to Discharge: No Barriers Identified   Patient Goals and CMS Choice Patient states their goals for this hospitalization and ongoing recovery are:: return home      Discharge Placement                       Discharge Plan and Services                DME Arranged: N/A DME Agency: NA                  Social Determinants of Health (SDOH) Interventions     Readmission Risk Interventions No flowsheet data found.

## 2021-01-28 NOTE — Progress Notes (Signed)
Orthopedic Tech Progress Note Patient Details:  Deanna Dorsey January 06, 1956 [id number]  CPM Left Knee CPM Left Knee: Off CPM Right Knee CPM Right Knee: On Right Knee Flexion (Degrees): 40 Right Knee Extension (Degrees): 10 Additional Comments: on at 9am; off at 10am  Post Interventions Patient Tolerated: Well Instructions Provided: Adjustment of device, Care of device  [place] A Domique Clapper 01/28/2021, 10:22 AM

## 2021-02-21 NOTE — Discharge Summary (Signed)
Physician Discharge Summary   Patient ID: Deanna Dorsey MRN: [id number] DOB/AGE: 21-Apr-1955 65 y.o.  Admit date: 01/27/2021 Discharge date: 01/28/2021  Primary Diagnosis: Failed right total knee arthroplasty  Admission Diagnoses:  Past Medical History:  Diagnosis Date   Arthritis    Asthma 07/17/2020   vaccine triggered,  allergy triggered asthma   Family history of adverse reaction to anesthesia    sister slow to wake up   History of kidney stones    1982   Pneumonia    PONV (postoperative nausea and vomiting)    Skin cancer    legs and face   Sleep apnea 2003   had surgical fix   Discharge Diagnoses:   Principal Problem:   Failed total knee arthroplasty ([place]) Active Problems:   Status post revision of total knee replacement, right  Estimated body mass index is 24.45 kg/m as calculated from the following:   Height as of this encounter: 5\' 3"  (1.6 m).   Weight as of this encounter: 62.6 kg.  Procedure:  Procedure(s) (LRB): TOTAL KNEE REVISION (Right)   Consults: None  HPI: The patient is a 65 year old female with a painful right total knee arthroplasty with very limited range of motion.  She had a bone scan, which showed increased uptake around the tibia consistent with possible loosening.  Given her  pain and dysfunction she presents now for total knee arthroplasty revision.  Laboratory Data: Admission on 01/27/2021, Discharged on 01/28/2021  Component Date Value Ref Range Status   WBC 01/28/2021 11.4 (H)  4.0 - 10.5 K/uL Final   RBC 01/28/2021 3.45 (L)  3.87 - 5.11 MIL/uL Final   Hemoglobin 01/28/2021 10.8 (L)  12.0 - 15.0 g/dL Final   HCT 01/28/2021 32.8 (L)  36.0 - 46.0 % Final   MCV 01/28/2021 95.1  80.0 - 100.0 fL Final   MCH 01/28/2021 31.3  26.0 - 34.0 pg Final   MCHC 01/28/2021 32.9  30.0 - 36.0 g/dL Final   RDW 01/28/2021 13.1  11.5 - 15.5 % Final   Platelets 01/28/2021 229  150 - 400 K/uL Final   nRBC 01/28/2021 0.0  0.0 - 0.2 % Final   Performed  at [facility], [place] [address] [place], [place] [address]   Sodium 01/28/2021 136  135 - 145 mmol/L Final   Potassium 01/28/2021 4.8  3.5 - 5.1 mmol/L Final   Chloride 01/28/2021 106  98 - 111 mmol/L Final   CO2 01/28/2021 26  22 - 32 mmol/L Final   Glucose, Bld 01/28/2021 172 (H)  70 - 99 mg/dL Final   Glucose reference range applies only to samples taken after fasting for at least 8 hours.   BUN 01/28/2021 7 (L)  8 - 23 mg/dL Final   Creatinine, Ser 01/28/2021 0.48  0.44 - 1.00 mg/dL Final   Calcium 01/28/2021 8.5 (L)  8.9 - 10.3 mg/dL Final   GFR, Estimated 01/28/2021 >60  >60 mL/min Final   Comment: (NOTE) Calculated using the CKD-EPI Creatinine Equation (2021)    Anion gap 01/28/2021 4 (L)  5 - 15 Final   Performed at [facility], [place] [address] [place], [place] [address]  Orders Only on 01/25/2021  Component Date Value Ref Range Status   SARS Coronavirus 2 01/25/2021 RESULT: NEGATIVE   Final   Comment: RESULT: NEGATIVESARS-CoV-2 INTERPRETATION:A NEGATIVE  test result means that SARS-CoV-2 RNA was not present in the specimen above the limit of detection of this test. This does  not preclude a possible SARS-CoV-2 infection and should not be used as the  sole basis for patient management decisions. Negative results must be combined with clinical observations, patient history, and epidemiological information. Optimum specimen types and timing for peak viral levels during infections caused by SARS-CoV-2  have not been determined. Collection of multiple specimens or types of specimens may be necessary to detect virus. Improper specimen collection and handling, sequence variability under primers/probes, or organism present below the limit of detection may  lead to false negative results. Positive and negative predictive values of testing are highly dependent on prevalence. False negative test results are more likely when prevalence of disease is high.The  expected result is NEGATIVE.Fact S                          heet for  Healthcare Providers: LocalChronicle.no Sheet for Patients: SalonLookup.es Reference Range - Negative   Hospital Outpatient Visit on 01/19/2021  Component Date Value Ref Range Status   MRSA, PCR 01/19/2021 NEGATIVE  NEGATIVE Final   Staphylococcus aureus 01/19/2021 NEGATIVE  NEGATIVE Final   Comment: (NOTE) The Xpert SA Assay (FDA approved for NASAL specimens in patients 31 years of age and older), is one component of a comprehensive surveillance program. It is not intended to diagnose infection nor to guide or monitor treatment. Performed at [facility], [place] [address], [place], [place] [address]    WBC 01/19/2021 4.4  4.0 - 10.5 K/uL Final   RBC 01/19/2021 4.46  3.87 - 5.11 MIL/uL Final   Hemoglobin 01/19/2021 14.1  12.0 - 15.0 g/dL Final   HCT 01/19/2021 42.4  36.0 - 46.0 % Final   MCV 01/19/2021 95.1  80.0 - 100.0 fL Final   MCH 01/19/2021 31.6  26.0 - 34.0 pg Final   MCHC 01/19/2021 33.3  30.0 - 36.0 g/dL Final   RDW 01/19/2021 12.9  11.5 - 15.5 % Final   Platelets 01/19/2021 286  150 - 400 K/uL Final   nRBC 01/19/2021 0.0  0.0 - 0.2 % Final   Performed at [facility], [place], [place] [address] [place], [place] [address]   Sodium 01/19/2021 139  135 - 145 mmol/L Final   Potassium 01/19/2021 3.9  3.5 - 5.1 mmol/L Final   Chloride 01/19/2021 104  98 - 111 mmol/L Final   CO2 01/19/2021 29  22 - 32 mmol/L Final   Glucose, Bld 01/19/2021 86  70 - 99 mg/dL Final   Glucose reference range applies only to samples taken after fasting for at least 8 hours.   BUN 01/19/2021 19  8 - 23 mg/dL Final   Creatinine, Ser 01/19/2021 0.57  0.44 - 1.00 mg/dL Final   Calcium 01/19/2021 9.5  8.9 - 10.3 mg/dL Final   Total Protein 01/19/2021 7.6  6.5 - 8.1 g/dL Final   Albumin 01/19/2021 4.5  3.5 - 5.0 g/dL Final   AST 01/19/2021 15  15 -  41 U/L Final   ALT 01/19/2021 15  0 - 44 U/L Final   Alkaline Phosphatase 01/19/2021 65  38 - 126 U/L Final   Total Bilirubin 01/19/2021 0.8  0.3 - 1.2 mg/dL Final   GFR, Estimated 01/19/2021 >60  >60 mL/min Final   Comment: (NOTE) Calculated using the CKD-EPI Creatinine Equation (2021)    Anion gap 01/19/2021 6  5 - 15 Final   Performed at [facility], [place] [address], [place], [place] [address]  Prothrombin Time 01/19/2021 12.5  11.4 - 15.2 seconds Final   INR 01/19/2021 0.9  0.8 - 1.2 Final   Comment: (NOTE) INR goal varies based on device and disease states. Performed at [facility], [place] [address] [place], [place] [address]    ABO/RH(D) 01/19/2021 A POS   Final   Antibody Screen 01/19/2021 NEG   Final   Sample Expiration 01/19/2021 01/30/2021,2359   Final   Extend sample reason 01/19/2021    Final                   Value:NO TRANSFUSIONS OR PREGNANCY IN THE PAST 3 MONTHS Performed at [place] [address], [place], [place] [address]      X-Rays:No results found.  EKG: Orders placed or performed during the hospital encounter of 02/04/19   EKG   EKG     Hospital Course: Massa Pe is a 65 y.o. who was admitted to [facility]. They were brought to the operating room on 01/27/2021 and underwent Procedure(s): [place].  Patient tolerated the procedure well and was later transferred to the recovery room and then to the orthopaedic floor for postoperative care. They were given PO and IV analgesics for pain control following their surgery. They were given 24 hours of postoperative antibiotics of  Anti-infectives (From admission, onward)    Start     Dose/Rate Route Frequency Ordered Stop   01/27/21 1445  ceFAZolin (ANCEF) IVPB 2g/100 mL premix        2 g 200 mL/hr over 30 Minutes Intravenous Every 6 hours 01/27/21 1348 01/27/21 2135   01/27/21 0630  ceFAZolin (ANCEF) IVPB 2g/100 mL premix         2 g 200 mL/hr over 30 Minutes Intravenous On call to O.R. 01/27/21 1638 01/27/21 0846      and started on DVT prophylaxis in the form of Xarelto and TED hose.   PT and OT were ordered for total joint protocol. Discharge planning consulted to help with postop disposition and equipment needs.  Patient had an uneventful night on the evening of surgery. They started to get up OOB with therapy on 01/27/21. Pt was seen during rounds and was ready to go home pending progress with therapy. She worked with therapy on POD #1 and was meeting goals. Pt was discharged to home later that day in stable condition.  Diet: Regular diet Activity: WBAT Follow-up: in two weeks Disposition: Home Discharged Condition: good   Discharge Instructions     Call MD / Call 911   Complete by: As directed    If you experience chest pain or shortness of breath, CALL 911 and be transported to the hospital emergency room.  If you develope a fever above 101 F, pus (white drainage) or increased drainage or redness at the wound, or calf pain, call your surgeon's office.   Change dressing   Complete by: As directed    You may remove the bulky bandage (ACE wrap and gauze) two days after surgery. You will have an adhesive waterproof bandage underneath. Leave this in place until your first follow-up appointment.   Constipation Prevention   Complete by: As directed    Drink plenty of fluids.  Prune juice may be helpful.  You may use a stool softener, such as Colace (over the counter) 100 mg twice a day.  Use MiraLax (over the counter) for constipation as needed.   Diet - low sodium heart healthy  Complete by: As directed    Do not put a pillow under the knee. Place it under the heel.   Complete by: As directed    Driving restrictions   Complete by: As directed    No driving for two weeks   Post-operative opioid taper instructions:   Complete by: As directed    POST-OPERATIVE OPIOID TAPER INSTRUCTIONS: It is important  to wean off of your opioid medication as soon as possible. If you do not need pain medication after your surgery it is ok to stop day one. Opioids include: Codeine, Hydrocodone(Norco, Vicodin), Oxycodone(Percocet, oxycontin) and hydromorphone amongst others.  Long term and even short term use of opiods can cause: Increased pain response Dependence Constipation Depression Respiratory depression And more.  Withdrawal symptoms can include Flu like symptoms Nausea, vomiting And more Techniques to manage these symptoms Hydrate well Eat regular healthy meals Stay active Use relaxation techniques(deep breathing, meditating, yoga) Do Not substitute Alcohol to help with tapering If you have been on opioids for less than two weeks and do not have pain than it is ok to stop all together.  Plan to wean off of opioids This plan should start within one week post op of your joint replacement. Maintain the same interval or time between taking each dose and first decrease the dose.  Cut the total daily intake of opioids by one tablet each day Next start to increase the time between doses. The last dose that should be eliminated is the evening dose.      TED hose   Complete by: As directed    Use stockings (TED hose) for three weeks on both leg(s).  You may remove them at night for sleeping.   Weight bearing as tolerated   Complete by: As directed       Allergies as of 01/28/2021       Reactions   Doxycycline Anaphylaxis, Rash   Pt started swelling and breaking out with rash per dr         Medication List     STOP taking these medications    Multivitamin Adult Tabs   Vitamin D 125 MCG (5000 UT) Caps   vitamin E 180 MG (400 UNITS) capsule       TAKE these medications    atorvastatin 10 MG tablet Commonly known as: LIPITOR Take 10 mg by mouth daily.   esomeprazole 40 MG capsule Commonly known as: NEXIUM Take 40 mg by mouth daily.   fluticasone 50 MCG/ACT nasal  spray Commonly known as: FLONASE Place 1 spray into both nostrils daily.   gabapentin 300 MG capsule Commonly known as: NEURONTIN Take 1 capsule by mouth three times a day for two weeks following surgery.Then take 1 capsule two times a day for two weeks. Then take 1 capsule once a day for two weeks. Then discontinue. What changed: additional instructions   HYDROcodone-acetaminophen 5-325 MG tablet Commonly known as: NORCO/VICODIN Take 1 to 2 tablets by mouth every 6  hours as needed for severe pain.   methocarbamol 500 MG tablet Commonly known as: ROBAXIN Take 1 tablet by mouth every 6  hours as needed for muscle spasms.   montelukast 10 MG tablet Commonly known as: SINGULAIR Take 1 tablet (10 mg total) by mouth at bedtime.   NONFORMULARY OR COMPOUNDED ITEM T3   45mcg   progesterone 100 MG capsule Commonly known as: PROMETRIUM Take 100 mg by mouth daily.   promethazine 12.5 MG tablet Commonly known as: PHENERGAN Take  1 tablet (12.5 mg total) by mouth every 6 (six) hours as needed for nausea or vomiting.   traMADol 50 MG tablet Commonly known as: ULTRAM Take 1 to 2 tablets by mouth every 6 hours as needed for moderate pain.   Trelegy Ellipta 200-62.5-25 MCG/ACT Aepb Generic drug: Fluticasone-Umeclidin-Vilant Inhale 1 puff into the lungs daily.   Xarelto 10 MG Tabs tablet Generic drug: rivaroxaban Take 1 tab once a day for three weeks after surgery. Then change to 81 mg aspirin once a day for three weeks. Then discontinue aspirin.               Discharge Care Instructions  (From admission, onward)           Start     Ordered   01/28/21 0000  Weight bearing as tolerated        01/28/21 0757   01/28/21 0000  Change dressing       Comments: You may remove the bulky bandage (ACE wrap and gauze) two days after surgery. You will have an adhesive waterproof bandage underneath. Leave this in place until your first follow-up appointment.   01/28/21 0757             Follow-up Information     Gaynelle Arabian, MD Follow up in 2 week(s).   Specialty: Orthopedic Surgery Contact information: [address] [place] [address] [phone]                 Signed: Fenton Foy, MBA, PA-C Orthopedic Surgery 02/21/2021, 11:10 PM

## 2021-03-22 ENCOUNTER — Encounter (INDEPENDENT_AMBULATORY_CARE_PROVIDER_SITE_OTHER): Payer: Self-pay | Admitting: *Deleted

## 2021-05-10 ENCOUNTER — Encounter (INDEPENDENT_AMBULATORY_CARE_PROVIDER_SITE_OTHER): Payer: Self-pay

## 2021-05-10 ENCOUNTER — Other Ambulatory Visit (INDEPENDENT_AMBULATORY_CARE_PROVIDER_SITE_OTHER): Payer: Self-pay

## 2021-05-10 ENCOUNTER — Encounter (INDEPENDENT_AMBULATORY_CARE_PROVIDER_SITE_OTHER): Payer: Self-pay | Admitting: Gastroenterology

## 2021-05-10 ENCOUNTER — Ambulatory Visit (INDEPENDENT_AMBULATORY_CARE_PROVIDER_SITE_OTHER): Payer: Medicare Other | Admitting: Gastroenterology

## 2021-05-10 ENCOUNTER — Other Ambulatory Visit: Payer: Self-pay

## 2021-05-10 VITALS — BP 148/77 | HR 70 | Temp 98.1°F | Ht 63.0 in | Wt 135.5 lb

## 2021-05-10 DIAGNOSIS — R0989 Other specified symptoms and signs involving the circulatory and respiratory systems: Secondary | ICD-10-CM

## 2021-05-10 DIAGNOSIS — R1030 Lower abdominal pain, unspecified: Secondary | ICD-10-CM

## 2021-05-10 DIAGNOSIS — R6881 Early satiety: Secondary | ICD-10-CM

## 2021-05-10 DIAGNOSIS — R634 Abnormal weight loss: Secondary | ICD-10-CM

## 2021-05-10 DIAGNOSIS — R09A2 Foreign body sensation, throat: Secondary | ICD-10-CM

## 2021-05-10 MED ORDER — PANTOPRAZOLE SODIUM 40 MG PO TBEC: 40.0000 mg | DELAYED_RELEASE_TABLET | Freq: Every day | ORAL | 1 refills | Status: DC

## 2021-05-10 NOTE — Progress Notes (Signed)
Referring Provider: Lanelle Bal, PA-C Primary Care Physician:  Lanelle Bal, PA-C Primary GI Physician: new  Chief Complaint  Patient presents with   Gastroesophageal Reflux    Pt arrives as a new patient. Referred for worsening acid reflux, has mucus in back of throat, has tried flonase, feels like it sits there, taking nexium.    HPI:   Saralynn Langhorst is a 66 y.o. female with past medical history of asthma, arthritis, kidney stones, skin cancer and sleep apnea(had surgical intervention previously).   Patient presenting today as a new patient for thick feeling in her throat as well as lower abdominal pain.   Patient states that after having the COVID vaccine last year, she had some adverse reactions, she ended up needing to see a pulmonologist. She has had elevated BP since then as well. She states that she did have covid infection in September of 2021. She reports that she has lost about 33 lbs since she got the vaccine early last year. She feels that she has a thickness or phlegm in the back of her throat constantly, she has tried flonase and the navage nasal irrigation unit, states flonase has helped some but she still notices the sensation. She has been on nexium for atleast 4-5 months without any change in symptoms. She denies any issues with dysphagia. She does not notice heartburn or acid regurgitation except on very rare occasion. She states that she does feel hoarse often, occasionally has a dry cough and sore throat.  She does have some lower abdominal pain at times, sometimes worse after she eats something like meat, seafood or dairy. Sometimes pain is associated with nausea. Pain usually resolves on its own. She also endorses early satiety, she states she never is able to finish her meal anymore and if she does eat more than she knows she should, she will have worse lower abdominal pain and feel very fatigued after eating.  She does not have issues with constipation or diarrhea,  and typically has a BM every day without issue. She did take an antibiotic earlier this year for a possible sinus infection which gave her diarrhea, however, this resolved after she stopped the medication. She denies rectal bleeding, melena, nausea, vomiting.   NSAID HMC:NOBS occasionally Social hx: no tobacco, occasional glass of wine Fam JG:GEZMOQ had CRC in her 43s  Last Colonoscopy:in [place], maybe Oct/Nov 2019, normal , patient reports this was her third one, all were normal Last Endoscopy:never  Past Medical History:  Diagnosis Date   Arthritis    Asthma 07/17/2020   vaccine triggered,  allergy triggered asthma   Family history of adverse reaction to anesthesia    sister slow to wake up   History of kidney stones    1982   Pneumonia    PONV (postoperative nausea and vomiting)    Skin cancer    legs and face   Sleep apnea 2003   had surgical fix    Past Surgical History:  Procedure Laterality Date   ABDOMINAL HYSTERECTOMY  2006   BACK SURGERY     2016   cervical   KNEE ARTHROTOMY Right 02/06/2019   Procedure: KNEE ARTHROTOMY; SCAR EXCISION;  Surgeon: Gaynelle Arabian, MD;  Location: [facility];  Service: Orthopedics;  Laterality: Right;  43min   KNEE CARTILAGE SURGERY Right 2006   MENISCUS REPAIR Right 2016   NASAL SINUS SURGERY  2004   RADIOACTIVE SEED GUIDED EXCISIONAL BREAST BIOPSY Left 08/25/2020   Procedure: RADIOACTIVE SEED  GUIDED EXCISIONAL LEFT BREAST BIOPSY;  Surgeon: Rolm Bookbinder, MD;  Location: [place];  Service: General;  Laterality: Left;   REPLACEMENT TOTAL KNEE Right 2019   TONSILLECTOMY     2004   TOTAL KNEE ARTHROPLASTY Left 09/16/2020   Procedure: LEFT TOTAL KNEE ARTHROPLASTY;  Surgeon: Gaynelle Arabian, MD;  Location: [facility];  Service: Orthopedics;  Laterality: Left;   TOTAL KNEE REVISION Right 01/27/2021   Procedure: TOTAL KNEE REVISION;  Surgeon: Gaynelle Arabian, MD;  Location: [facility];  Service: Orthopedics;  Laterality: Right;     Current Outpatient Medications  Medication Sig Dispense Refill   atorvastatin (LIPITOR) 10 MG tablet Take 10 mg by mouth daily.     esomeprazole (NEXIUM) 40 MG capsule Take 40 mg by mouth daily.     fluticasone (FLONASE) 50 MCG/ACT nasal spray Place 1 spray into both nostrils daily. 16 g 2   Fluticasone-Umeclidin-Vilant (TRELEGY ELLIPTA) 200-62.5-25 MCG/INH AEPB Inhale 1 puff into the lungs daily. (Patient not taking: No sig reported) 60 each 11   gabapentin (NEURONTIN) 300 MG capsule Take 1 capsule by mouth three times a day for two weeks following surgery.Then take 1 capsule two times a day for two weeks. Then take 1 capsule once a day for two weeks. Then discontinue. 84 capsule 0   HYDROcodone-acetaminophen (NORCO/VICODIN) 5-325 MG tablet Take 1 to 2 tablets by mouth every 6  hours as needed for severe pain. 42 tablet 0   methocarbamol (ROBAXIN) 500 MG tablet Take 1 tablet by mouth every 6  hours as needed for muscle spasms. 40 tablet 0   montelukast (SINGULAIR) 10 MG tablet Take 1 tablet (10 mg total) by mouth at bedtime. 30 tablet 11   NONFORMULARY OR COMPOUNDED ITEM T3   89mcg     progesterone (PROMETRIUM) 100 MG capsule Take 100 mg by mouth daily.     promethazine (PHENERGAN) 12.5 MG tablet Take 1 tablet (12.5 mg total) by mouth every 6 (six) hours as needed for nausea or vomiting. (Patient not taking: No sig reported) 30 tablet 0   rivaroxaban (XARELTO) 10 MG TABS tablet Take 1 tab once a day for three weeks after surgery. Then change to 81 mg aspirin once a day for three weeks. Then discontinue aspirin. 21 tablet 0   traMADol (ULTRAM) 50 MG tablet Take 1 to 2 tablets by mouth every 6 hours as needed for moderate pain. 40 tablet 0   No current facility-administered medications for this visit.    Allergies as of 05/10/2021 - Review Complete 05/10/2021  Allergen Reaction Noted   Doxycycline Anaphylaxis and Rash 01/28/2019    No family history on file.  Social History    Socioeconomic History   Marital status: Widowed    Spouse name: Not on file   Number of children: Not on file   Years of education: Not on file   Highest education level: Not on file  Occupational History   Not on file  Tobacco Use   Smoking status: Never   Smokeless tobacco: Never  Vaping Use   Vaping Use: Never used  Substance and Sexual Activity   Alcohol use: Not Currently    Alcohol/week: 1.0 standard drink    Types: 1 Glasses of wine per week    Comment: rare   Drug use: Never   Sexual activity: Not Currently    Birth control/protection: Surgical  Other Topics Concern   Not on file  Social History Narrative   Not on file  Social Determinants of Health   Financial Resource Strain: Not on file  Food Insecurity: Not on file  Transportation Needs: Not on file  Physical Activity: Not on file  Stress: Not on file  Social Connections: Not on file   Review of systems General: negative for malaise, night sweats, fever, chills +weight loss Neck: Negative for lumps, goiter, pain and significant neck swelling Resp: Negative for cough, wheezing, dyspnea at rest CV: Negative for chest pain, leg swelling, palpitations, orthopnea GI: denies melena, hematochezia, vomiting, diarrhea, constipation, dysphagia, odynophagia +early satiety +unintentional weight loss +globus sensation in throat +nausea MSK: Negative for joint pain or swelling, back pain, and muscle pain. Derm: Negative for itching or rash Psych: Denies depression, anxiety, memory loss, confusion. No homicidal or suicidal ideation.  Heme: Negative for prolonged bleeding, bruising easily, and swollen nodes. Endocrine: Negative for cold or heat intolerance, polyuria, polydipsia and goiter. Neuro: negative for tremor, gait imbalance, syncope and seizures. The remainder of the review of systems is noncontributory.  Physical Exam: There were no vitals taken for this visit. General:   Alert and oriented. No distress  noted. Pleasant and cooperative.  Head:  Normocephalic and atraumatic. Eyes:  Conjuctiva clear without scleral icterus. Mouth:  Oral mucosa pink and moist. Good dentition. No lesions. Heart: Normal rate and rhythm, s1 and s2 heart sounds present.  Lungs: Clear lung sounds in all lobes. Respirations equal and unlabored. Abdomen:  +BS, soft, non-tender and non-distended. No rebound or guarding. No HSM or masses noted. Derm: No palmar erythema or jaundice Msk:  Symmetrical without gross deformities. Normal posture. Extremities:  Without edema. Neurologic:  Alert and  oriented x4 Psych:  Alert and cooperative. Normal mood and affect.  Invalid input(s): 6 MONTHS   ASSESSMENT: Taitum Menton is a 66 y.o. female presenting today as a new patient for globus sensation in throat and lower abdominal pain.   Patient with feeling of a thickness in her throat, despite treatment for both allergies and GERD, she denies any issues with dysphagia or odynophagia. Though she is without obvious reflux symptoms, globus sensation could still be related to GERD. We will switch her from nexium to protonix to see if this helps any with her symptoms. Also complains of early satiety anytime she eats. she denies melena, upper abdominal pain, vomiting, and does not take NSAIDs.  In regards to lower abdominal pain, this occurs after eating, especially worse after certain foods such as dairy, meats or seafood. Sometimes she will have nausea and fatigue and reports pain is worse if she overeats. She also endorses approx 33 pounds of weight loss over the past year. Abdominal exam is benign. She denies any rectal bleeding, given these symptoms and her family history, we will also proceed with colonoscopy for further evaluation.   Indications, risks and benefits of procedure discussed in detail with patient. Patient verbalized understanding and is in agreement to proceed with EGD/Colonoscopy at this time.    PLAN:  Stop  nexium 2. Start protonix 40mg  once daily 3.  EGD and colonoscopy 4. Obtain records from previous colonoscopies ([facility] in [place], [place])  Follow Up: TBD after procedures  Lezlee Gills L. Alver Sorrow, MSN, APRN, AGNP-C Adult-Gerontology Nurse Practitioner [facility]

## 2021-05-10 NOTE — Patient Instructions (Addendum)
Stop nexium I have sent protonix 40mg  for you to take once a day 30-45 minutes prior to eating in the mornings We will get you scheduled for EGD and colonoscopy further evaluation of your symptoms. Please keep a food journal for 1-2 weeks and try to write down which foods seem to correlate to your symptoms when they occur. Avoid trigger foods such as greasy, spicy, fried, tomato based foods, caffeine, chocolate and alcohol as these can worsen reflux symptoms Stay upright 2-3 hours after eating.   Follow up 3 months

## 2021-06-11 ENCOUNTER — Ambulatory Visit (HOSPITAL_COMMUNITY)
Admission: RE | Admit: 2021-06-11 | Discharge: 2021-06-11 | Disposition: A | Discharge status: Home / Self Care | Payer: Medicare Other | Attending: Gastroenterology | Admitting: Gastroenterology

## 2021-06-11 ENCOUNTER — Ambulatory Visit (HOSPITAL_COMMUNITY): Payer: Medicare Other | Admitting: Anesthesiology

## 2021-06-11 ENCOUNTER — Encounter (HOSPITAL_COMMUNITY): Payer: Self-pay | Admitting: Gastroenterology

## 2021-06-11 ENCOUNTER — Encounter (HOSPITAL_COMMUNITY)
Admission: RE | Disposition: A | Discharge status: Home / Self Care | Payer: Self-pay | Source: Home / Self Care | Attending: Gastroenterology

## 2021-06-11 ENCOUNTER — Other Ambulatory Visit: Payer: Self-pay

## 2021-06-11 ENCOUNTER — Ambulatory Visit (HOSPITAL_BASED_OUTPATIENT_CLINIC_OR_DEPARTMENT_OTHER): Payer: Medicare Other | Admitting: Anesthesiology

## 2021-06-11 DIAGNOSIS — K5730 Diverticulosis of large intestine without perforation or abscess without bleeding: Secondary | ICD-10-CM | POA: Insufficient documentation

## 2021-06-11 DIAGNOSIS — R1030 Lower abdominal pain, unspecified: Secondary | ICD-10-CM | POA: Insufficient documentation

## 2021-06-11 DIAGNOSIS — K449 Diaphragmatic hernia without obstruction or gangrene: Secondary | ICD-10-CM

## 2021-06-11 DIAGNOSIS — Z79899 Other long term (current) drug therapy: Secondary | ICD-10-CM | POA: Diagnosis not present

## 2021-06-11 DIAGNOSIS — G4730 Sleep apnea, unspecified: Secondary | ICD-10-CM | POA: Insufficient documentation

## 2021-06-11 DIAGNOSIS — R0989 Other specified symptoms and signs involving the circulatory and respiratory systems: Secondary | ICD-10-CM

## 2021-06-11 DIAGNOSIS — J45909 Unspecified asthma, uncomplicated: Secondary | ICD-10-CM | POA: Diagnosis not present

## 2021-06-11 DIAGNOSIS — F458 Other somatoform disorders: Secondary | ICD-10-CM

## 2021-06-11 DIAGNOSIS — R6881 Early satiety: Secondary | ICD-10-CM | POA: Diagnosis not present

## 2021-06-11 DIAGNOSIS — R634 Abnormal weight loss: Secondary | ICD-10-CM | POA: Diagnosis not present

## 2021-06-11 DIAGNOSIS — R109 Unspecified abdominal pain: Secondary | ICD-10-CM | POA: Diagnosis not present

## 2021-06-11 HISTORY — PX: ESOPHAGOGASTRODUODENOSCOPY (EGD) WITH PROPOFOL: SHX5813

## 2021-06-11 HISTORY — PX: COLONOSCOPY WITH PROPOFOL: SHX5780

## 2021-06-11 LAB — HM COLONOSCOPY

## 2021-06-11 SURGERY — ESOPHAGOGASTRODUODENOSCOPY (EGD) WITH PROPOFOL
Anesthesia: General

## 2021-06-11 MED ORDER — LIDOCAINE HCL (CARDIAC) PF 100 MG/5ML IV SOSY
PREFILLED_SYRINGE | INTRAVENOUS | Status: DC | PRN
  Administered 2021-06-11: 50 mg via INTRAVENOUS

## 2021-06-11 MED ORDER — PROPOFOL 10 MG/ML IV BOLUS
INTRAVENOUS | Status: DC | PRN
  Administered 2021-06-11: 50 mg via INTRAVENOUS
  Administered 2021-06-11: 100 mg via INTRAVENOUS

## 2021-06-11 MED ORDER — LACTATED RINGERS IV SOLN: INTRAVENOUS | Status: DC

## 2021-06-11 MED ORDER — PROPOFOL 500 MG/50ML IV EMUL
INTRAVENOUS | Status: DC | PRN
  Administered 2021-06-11: 150 ug/kg/min via INTRAVENOUS

## 2021-06-11 NOTE — Anesthesia Postprocedure Evaluation (Signed)
Anesthesia Post Note ? ?Patient: Deanna Dorsey ? ?Procedure(s) Performed: ESOPHAGOGASTRODUODENOSCOPY (EGD) WITH PROPOFOL ?COLONOSCOPY WITH PROPOFOL ? ?Patient location during evaluation: Phase II ?Anesthesia Type: General ?Level of consciousness: awake ?Pain management: pain level controlled ?Vital Signs Assessment: post-procedure vital signs reviewed and stable ?Respiratory status: spontaneous breathing and respiratory function stable ?Cardiovascular status: blood pressure returned to baseline and stable ?Postop Assessment: no headache and no apparent nausea or vomiting ?Anesthetic complications: no ?Comments: Late entry ? ? ?No notable events documented. ? ? ?Last Vitals:  ?Vitals:  ? 06/11/21 1100 06/11/21 1231  ?BP: 127/71 (!) 94/44  ?Pulse: 83 74  ?Resp: 15 15  ?Temp: 36.7 ?C (!) 36.4 ?C  ?SpO2: 100% 100%  ?  ?Last Pain:  ?Vitals:  ? 06/11/21 1231  ?TempSrc: Oral  ?PainSc: 0-No pain  ? ? ?  ?  ?  ?  ?  ?  ? ?Louann Sjogren ? ? ? ? ?

## 2021-06-11 NOTE — Discharge Instructions (Addendum)
You are being discharged to home.  ?Resume your previous diet.  ?Schedule CT chest with IV contrast. ?Stop Protonix ?Your physician has recommended a repeat colonoscopy in 10 years for screening purposes.  ?

## 2021-06-11 NOTE — Anesthesia Procedure Notes (Signed)
Date/Time: 06/11/2021 11:56 AM ?Performed by: Orlie Dakin, CRNA ?Pre-anesthesia Checklist: Patient identified, Emergency Drugs available, Patient being monitored and Suction available ?Patient Re-evaluated:Patient Re-evaluated prior to induction ?Oxygen Delivery Method: Nasal cannula ?Induction Type: IV induction ?Placement Confirmation: positive ETCO2 ? ? ? ? ?

## 2021-06-11 NOTE — Transfer of Care (Signed)
Immediate Anesthesia Transfer of Care Note ? ?Patient: Deanna Dorsey ? ?Procedure(s) Performed: ESOPHAGOGASTRODUODENOSCOPY (EGD) WITH PROPOFOL ?COLONOSCOPY WITH PROPOFOL ? ?Patient Location: Endoscopy Unit ? ?Anesthesia Type:General ? ?Level of Consciousness: awake ? ?Airway & Oxygen Therapy: Patient Spontanous Breathing ? ?Post-op Assessment: Report given to RN and Post -op Vital signs reviewed and stable ? ?Post vital signs: Reviewed and stable ? ?Last Vitals:  ?Vitals Value Taken Time  ?BP    ?Temp    ?Pulse    ?Resp    ?SpO2    ? ? ?Last Pain:  ?Vitals:  ? 06/11/21 1100  ?TempSrc: Oral  ?PainSc: 0-No pain  ?   ? ?Patients Stated Pain Goal: 6 (06/11/21 1100) ? ?Complications: No notable events documented. ?

## 2021-06-11 NOTE — Anesthesia Preprocedure Evaluation (Signed)
Anesthesia Evaluation  ?Patient identified by MRN, date of birth, ID band ?Patient awake ? ? ? ?Reviewed: ?Allergy & Precautions, H&P , NPO status , Patient's Chart, lab work & pertinent test results, reviewed documented beta blocker date and time  ? ?History of Anesthesia Complications ?(+) PONV and history of anesthetic complications ? ?Airway ?Mallampati: II ? ?TM Distance: >3 FB ?Neck ROM: full ? ? ? Dental ?no notable dental hx. ? ?  ?Pulmonary ?asthma , sleep apnea ,  ?  ?Pulmonary exam normal ?breath sounds clear to auscultation ? ? ? ? ? ? Cardiovascular ?Exercise Tolerance: Good ?negative cardio ROS ? ? ?Rhythm:regular Rate:Normal ? ? ?  ?Neuro/Psych ?negative neurological ROS ? negative psych ROS  ? GI/Hepatic ?negative GI ROS, Neg liver ROS,   ?Endo/Other  ?negative endocrine ROS ? Renal/GU ?negative Renal ROS  ?negative genitourinary ?  ?Musculoskeletal ? ? Abdominal ?  ?Peds ? Hematology ?negative hematology ROS ?(+)   ?Anesthesia Other Findings ? ? Reproductive/Obstetrics ?negative OB ROS ? ?  ? ? ? ? ? ? ? ? ? ? ? ? ? ?  ?  ? ? ? ? ? ? ? ? ?Anesthesia Physical ?Anesthesia Plan ? ?ASA: 2 ? ?Anesthesia Plan: General  ? ?Post-op Pain Management:   ? ?Induction:  ? ?PONV Risk Score and Plan: Propofol infusion ? ?Airway Management Planned:  ? ?Additional Equipment:  ? ?Intra-op Plan:  ? ?Post-operative Plan:  ? ?Informed Consent: I have reviewed the patients History and Physical, chart, labs and discussed the procedure including the risks, benefits and alternatives for the proposed anesthesia with the patient or authorized representative who has indicated his/her understanding and acceptance.  ? ? ? ?Dental Advisory Given ? ?Plan Discussed with: CRNA ? ?Anesthesia Plan Comments:   ? ? ? ? ? ? ?Anesthesia Quick Evaluation ? ?

## 2021-06-11 NOTE — Op Note (Signed)
[facility] ?Patient Name: Deanna Dorsey ?Procedure Date: 06/11/2021 11:34 AM ?MRN: [id number] ?Date of Birth: 29-Oct-1955 ?Attending MD: Maylon Peppers ,  ?CSN: [id number] ?Age: 66 ?Admit Type: Outpatient ?Procedure:                Upper GI endoscopy ?Indications:              Early satiety, Globus sensation ?Providers:                Maylon Peppers, Janeece Riggers, RN, Suzan Garibaldi.  ?                          Risa Grill, Technician ?Referring MD:              ?Medicines:                Monitored Anesthesia Care ?Complications:            No immediate complications. ?Estimated Blood Loss:     Estimated blood loss: none. ?Procedure:                Pre-Anesthesia Assessment: ?                          - Prior to the procedure, a History and Physical  ?                          was performed, and patient medications, allergies  ?                          and sensitivities were reviewed. The patient's  ?                          tolerance of previous anesthesia was reviewed. ?                          - The risks and benefits of the procedure and the  ?                          sedation options and risks were discussed with the  ?                          patient. All questions were answered and informed  ?                          consent was obtained. ?                          - ASA Grade Assessment: II - A patient with mild  ?                          systemic disease. ?                          After obtaining informed consent, the endoscope was  ?                          passed under direct vision. Throughout the  ?  procedure, the patient's blood pressure, pulse, and  ?                          oxygen saturations were monitored continuously. The  ?                          GIF-H190 ([id number]) scope was introduced through the  ?                          mouth, and advanced to the second part of duodenum.  ?                          The upper GI endoscopy was accomplished without  ?                           difficulty. The patient tolerated the procedure  ?                          well. ?Scope In: 11:58:32 AM ?Scope Out: 12:04:30 PM ?Total Procedure Duration: 0 hours 5 minutes 58 seconds  ?Findings: ?     A 11 cm hiatal hernia was found. The hiatal narrowing was 46 cm from the  ?     incisors. The Z-line was 35 cm from the incisors. ?     The entire examined stomach was normal. However, the intraabdominal  ?     portion of the gastric cavity was small. ?     The examined duodenum was normal. ?Impression:               - 11 cm hiatal hernia. ?                          - Normal stomach. ?                          - Normal examined duodenum. ?                          - No specimens collected. ?Moderate Sedation: ?     Per Anesthesia Care ?Recommendation:           - Discharge patient to home (ambulatory). ?                          - Resume previous diet. ?                          - Schedule CT chest with IV contrast. ?                          - Stop Protonix ?Procedure Code(s):        --- Professional --- ?                          [phone], Esophagogastroduodenoscopy, flexible,  ?                          transoral; diagnostic, including collection of  ?  specimen(s) by brushing or washing, when performed  ?                          (separate procedure) ?Diagnosis Code(s):        --- Professional --- ?                          K44.9, Diaphragmatic hernia without obstruction or  ?                          gangrene ?                          R68.81, Early satiety ?                          F45.8, Other somatoform disorders ?CPT copyright 2019 [employer] Association. All rights reserved. ?The codes documented in this report are preliminary and upon coder review may  ?be revised to meet current compliance requirements. ?Maylon Peppers, MD ?Maylon Peppers,  ?06/11/2021 12:36:30 PM ?This report has been signed electronically. ?Number of Addenda: 0 ?

## 2021-06-11 NOTE — Op Note (Signed)
[facility] ?Patient Name: Deanna Dorsey ?Procedure Date: 06/11/2021 12:06 PM ?MRN: [id number] ?Date of Birth: 06/03/1955 ?Attending MD: Maylon Peppers ,  ?CSN: [id number] ?Age: 66 ?Admit Type: Outpatient ?Procedure:                Colonoscopy ?Indications:              Abdominal pain ?Providers:                Maylon Peppers, Janeece Riggers, RN, Suzan Garibaldi.  ?                          Risa Grill, Technician ?Referring MD:              ?Medicines:                Monitored Anesthesia Care ?Complications:            No immediate complications. ?Estimated Blood Loss:     Estimated blood loss: none. ?Procedure:                Pre-Anesthesia Assessment: ?                          - Prior to the procedure, a History and Physical  ?                          was performed, and patient medications, allergies  ?                          and sensitivities were reviewed. The patient's  ?                          tolerance of previous anesthesia was reviewed. ?                          - The risks and benefits of the procedure and the  ?                          sedation options and risks were discussed with the  ?                          patient. All questions were answered and informed  ?                          consent was obtained. ?                          - ASA Grade Assessment: II - A patient with mild  ?                          systemic disease. ?                          After obtaining informed consent, the colonoscope  ?                          was passed under direct vision. Throughout the  ?  procedure, the patient's blood pressure, pulse, and  ?                          oxygen saturations were monitored continuously. The  ?                          PCF-HQ190L ([id number]) scope was introduced through  ?                          the anus and advanced to the the cecum, identified  ?                          by appendiceal orifice and ileocecal valve. The  ?                           colonoscopy was performed without difficulty. The  ?                          patient tolerated the procedure well. The quality  ?                          of the bowel preparation was excellent. ?Scope In: 12:09:00 PM ?Scope Out: 12:28:23 PM ?Scope Withdrawal Time: 0 hours 16 minutes 5 seconds  ?Total Procedure Duration: 0 hours 19 minutes 23 seconds  ?Findings: ?     The perianal and digital rectal examinations were normal. ?     A few medium-mouthed diverticula were found in the descending colon,  ?     transverse colon and ascending colon. ?     The retroflexed view of the distal rectum and anal verge was normal and  ?     showed no anal or rectal abnormalities. ?Impression:               - Diverticulosis in the descending colon, in the  ?                          transverse colon and in the ascending colon. ?                          - The distal rectum and anal verge are normal on  ?                          retroflexion view. ?                          - No specimens collected. ?Moderate Sedation: ?     Per Anesthesia Care ?Recommendation:           - Discharge patient to home (ambulatory). ?                          - Resume previous diet. ?                          - Repeat colonoscopy in 10 years for screening  ?  purposes. ?Procedure Code(s):        --- Professional --- ?                          [phone], Colonoscopy, flexible; diagnostic, including  ?                          collection of specimen(s) by brushing or washing,  ?                          when performed (separate procedure) ?Diagnosis Code(s):        --- Professional --- ?                          R10.9, Unspecified abdominal pain ?                          K57.30, Diverticulosis of large intestine without  ?                          perforation or abscess without bleeding ?CPT copyright 2019 American Medical Association. All rights reserved. ?The codes documented in this report are preliminary and upon coder review  may  ?be revised to meet current compliance requirements. ?Maylon Peppers, MD ?Maylon Peppers,  ?06/11/2021 12:38:46 PM ?This report has been signed electronically. ?Number of Addenda: 0 ?

## 2021-06-11 NOTE — H&P (Signed)
Deanna Dorsey is an 66 y.o. female.   ?Chief Complaint: abdominal pain, throat discomfort, weight loss ?HPI: Deanna Dorsey is a 66 y.o. female with PMH asthma,, arthritis, sleep apnea, presenting today as a new patient for weight loss, globus sensation in throat and lower abdominal pain.  ? ?Patient states that after she received her COVID-vaccine she has presented persistent weight loss of 33 pounds.  Has also persisted with discomfort in her throat which does not improve with PPI.  Has presented some intermittent episodes of abdominal pain in the lower abdomen. ? ?Past Medical History:  ?Diagnosis Date  ? Arthritis   ? Asthma 07/17/2020  ? vaccine triggered,  allergy triggered asthma  ? Family history of adverse reaction to anesthesia   ? sister slow to wake up  ? History of kidney stones   ? 1982  ? Pneumonia   ? PONV (postoperative nausea and vomiting)   ? Skin cancer   ? legs and face  ? Sleep apnea 2003  ? had surgical fix  ? ? ?Past Surgical History:  ?Procedure Laterality Date  ? ABDOMINAL HYSTERECTOMY  2006  ? BACK SURGERY    ? 2016   cervical  ? KNEE ARTHROTOMY Right 02/06/2019  ? Procedure: KNEE ARTHROTOMY; SCAR EXCISION;  Surgeon: Gaynelle Arabian, MD;  Location: [facility];  Service: Orthopedics;  Laterality: Right;  50mn  ? KNEE CARTILAGE SURGERY Right 2006  ? MENISCUS REPAIR Right 2016  ? NASAL SINUS SURGERY  2004  ? RADIOACTIVE SEED GUIDED EXCISIONAL BREAST BIOPSY Left 08/25/2020  ? Procedure: RADIOACTIVE SEED GUIDED EXCISIONAL LEFT BREAST BIOPSY;  Surgeon: WRolm Bookbinder MD;  [place] [facility]  Service: General;  Laterality: Left;  ? REPLACEMENT TOTAL KNEE Right 2019  ? TONSILLECTOMY    ? 2004  ? TOTAL KNEE ARTHROPLASTY Left 09/16/2020  ? Procedure: LEFT TOTAL KNEE ARTHROPLASTY;  Surgeon: AGaynelle Arabian MD;  Location: [facility];  Service: Orthopedics;  Laterality: Left;  ? TOTAL KNEE REVISION Right 01/27/2021  ? Procedure: TOTAL KNEE REVISION;  Surgeon: AGaynelle Arabian MD;   Location: [facility];  Service: Orthopedics;  Laterality: Right;  ? ? ?History reviewed. No pertinent family history. ?Social History:  reports that she has never smoked. She has never used smokeless tobacco. She reports that she does not currently use alcohol after a past usage of about 1.0 standard drink per week. She reports that she does not use drugs. ? ?Allergies:  ?Allergies  ?Allergen Reactions  ? Doxycycline Anaphylaxis and Rash  ?  Pt started swelling and breaking out with rash per dr   ? ? ?Medications Prior to Admission  ?Medication Sig Dispense Refill  ? atorvastatin (LIPITOR) 10 MG tablet Take 10 mg by mouth daily.    ? Garlic (GARLIQUE PO) Take 1 tablet by mouth daily.    ? levocetirizine (XYZAL) 5 MG tablet Take 5 mg by mouth every evening.    ? losartan-hydrochlorothiazide (HYZAAR) 50-12.5 MG tablet Take 1 tablet by mouth daily.    ? montelukast (SINGULAIR) 10 MG tablet Take 1 tablet (10 mg total) by mouth at bedtime. 30 tablet 11  ? NONFORMULARY OR COMPOUNDED ITEM Take 10 mcg by mouth daily. T3    ? OVER THE COUNTER MEDICATION Balance of Nature - Fruits and Veggies    ? pantoprazole (PROTONIX) 40 MG tablet Take 1 tablet (40 mg total) by mouth daily. 60 tablet 1  ? progesterone (PROMETRIUM) 100 MG capsule Take 100 mg by mouth at bedtime.    ?  Red Yeast Rice Extract (RED YEAST RICE PO) Take 1 capsule by mouth daily.    ? BIOTIN PO Take 1 tablet by mouth daily. (Patient not taking: Reported on 06/08/2021)    ? Cholecalciferol (VITAMIN D) 125 MCG (5000 UT) CAPS Take 5,000 Units by mouth daily. (Patient not taking: Reported on 06/08/2021)    ? Cyanocobalamin (B-12 PO) Take 1 tablet by mouth daily. (Patient not taking: Reported on 06/08/2021)    ? fluticasone (FLONASE) 50 MCG/ACT nasal spray Place 1 spray into both nostrils daily. (Patient not taking: Reported on 06/08/2021) 16 g 2  ? zinc gluconate 50 MG tablet Take 50 mg by mouth daily. (Patient not taking: Reported on 06/08/2021)    ? ? ?No results found for  this or any previous visit (from the past 48 hour(s)). ?No results found. ? ?Review of Systems  ?Constitutional:  Positive for unexpected weight change.  ?HENT:  Positive for sore throat.   ?Eyes: Negative.   ?Respiratory: Negative.    ?Cardiovascular: Negative.   ?Gastrointestinal:  Positive for abdominal pain.  ?Endocrine: Negative.   ?Genitourinary: Negative.   ?Musculoskeletal: Negative.   ?Skin: Negative.   ?Allergic/Immunologic: Negative.   ?Neurological: Negative.   ?Hematological: Negative.   ?Psychiatric/Behavioral: Negative.    ? ?Blood pressure 127/71, pulse 83, temperature 98 ?F (36.7 ?C), temperature source Oral, resp. rate 15, height '5\' 3"'$  (1.6 m), weight 59 kg, SpO2 100 %. ?Physical Exam  ?GENERAL: The patient is AO x3, in no acute distress. ?HEENT: Head is normocephalic and atraumatic. EOMI are intact. Mouth is well hydrated and without lesions. ?NECK: Supple. No masses ?LUNGS: Clear to auscultation. No presence of rhonchi/wheezing/rales. Adequate chest expansion ?HEART: RRR, normal s1 and s2. ?ABDOMEN: Soft, nontender, no guarding, no peritoneal signs, and nondistended. BS +. No masses. ?EXTREMITIES: Without any cyanosis, clubbing, rash, lesions or edema. ?NEUROLOGIC: AOx3, no focal motor deficit. ?SKIN: no jaundice, no rashes ? ?Assessment/Plan ?Deanna Dorsey is a 66 y.o. female with PMH asthma,, arthritis, sleep apnea, presenting today as a new patient for weight loss, globus sensation in throat and lower abdominal pain.  We will proceed with EGD and colonoscopy. ? ?Harvel Quale, MD ?06/11/2021, 11:55 AM ? ? ? ?

## 2021-06-14 ENCOUNTER — Other Ambulatory Visit (INDEPENDENT_AMBULATORY_CARE_PROVIDER_SITE_OTHER): Payer: Self-pay

## 2021-06-14 ENCOUNTER — Telehealth (INDEPENDENT_AMBULATORY_CARE_PROVIDER_SITE_OTHER): Payer: Self-pay

## 2021-06-14 DIAGNOSIS — K449 Diaphragmatic hernia without obstruction or gangrene: Secondary | ICD-10-CM

## 2021-06-14 NOTE — Telephone Encounter (Signed)
-----   Message from Harvel Quale, MD sent at 06/11/2021 12:45 PM EDT ----- ?Hi Darius Bump,  ? ?Can you please schedule a CT chest with IV con ? Dx: large hiatal hernia. ?Thanks, ? ?Maylon Peppers, MD ?Gastroenterology and Hepatology ?[facility] ? ? ?

## 2021-06-14 NOTE — Telephone Encounter (Addendum)
Ct scan is scheduled on Friday 05/18/21 at 4:00 pm and patient is aware  ?

## 2021-06-14 NOTE — Telephone Encounter (Signed)
Thanks

## 2021-06-15 ENCOUNTER — Encounter (HOSPITAL_COMMUNITY): Payer: Self-pay | Admitting: Gastroenterology

## 2021-06-18 ENCOUNTER — Ambulatory Visit (HOSPITAL_BASED_OUTPATIENT_CLINIC_OR_DEPARTMENT_OTHER)
Admission: RE | Admit: 2021-06-18 | Discharge: 2021-06-18 | Disposition: A | Discharge status: Home / Self Care | Payer: Medicare Other | Source: Ambulatory Visit | Attending: Gastroenterology | Admitting: Gastroenterology

## 2021-06-18 DIAGNOSIS — K449 Diaphragmatic hernia without obstruction or gangrene: Secondary | ICD-10-CM | POA: Insufficient documentation

## 2021-06-18 MED ORDER — IOHEXOL 300 MG/ML  SOLN
100.0000 mL | Freq: Once | INTRAMUSCULAR | Status: AC | PRN
  Administered 2021-06-18: 75 mL via INTRAVENOUS

## 2021-06-21 ENCOUNTER — Encounter (INDEPENDENT_AMBULATORY_CARE_PROVIDER_SITE_OTHER): Payer: Self-pay | Admitting: *Deleted

## 2021-06-22 ENCOUNTER — Encounter (HOSPITAL_COMMUNITY): Payer: Self-pay

## 2021-06-25 ENCOUNTER — Encounter: Payer: Self-pay | Admitting: Cardiovascular Disease

## 2021-06-25 ENCOUNTER — Ambulatory Visit (INDEPENDENT_AMBULATORY_CARE_PROVIDER_SITE_OTHER): Payer: Medicare Other | Admitting: Cardiovascular Disease

## 2021-06-25 VITALS — BP 102/76 | HR 77 | Ht 63.0 in | Wt 137.0 lb

## 2021-06-25 DIAGNOSIS — I10 Essential (primary) hypertension: Secondary | ICD-10-CM | POA: Diagnosis not present

## 2021-06-25 DIAGNOSIS — E7800 Pure hypercholesterolemia, unspecified: Secondary | ICD-10-CM

## 2021-06-25 DIAGNOSIS — R0602 Shortness of breath: Secondary | ICD-10-CM

## 2021-06-25 NOTE — Patient Instructions (Signed)
Medication Instructions:  ?No changes ?*If you need a refill on your cardiac medications before your next appointment, please call your pharmacy* ? ? ?Lab Work: ?None ordered ?If you have labs (blood work) drawn today and your tests are completely normal, you will receive your results only by: ?MyChart Message (if you have MyChart) OR ?A paper copy in the mail ?If you have any lab test that is abnormal or we need to change your treatment, we will call you to review the results. ? ? ?Testing/Procedures: ?Your physician has requested that you have an exercise tolerance test. For further information please visit [email or web address]. Please also follow instruction sheet, as given. This will take place at [address], [address]. ?Do not drink or eat foods with caffeine for 24 hours before the test. (Chocolate, coffee, tea, or energy drinks) ?If you use an inhaler, bring it with you to the test. ?Do not smoke for 4 hours before the test. ?Wear comfortable shoes and clothing. ? ?Your physician has requested that you have an echocardiogram. Echocardiography is a painless test that uses sound waves to create images of your heart. It provides your doctor with information about the size and shape of your heart and how well your heart?s chambers and valves are working. You may receive an ultrasound enhancing agent through an IV if needed to better visualize your heart during the echo.This procedure takes approximately one hour. There are no restrictions for this procedure. This will take place at the [address].  ? ? ? ?Follow-Up: ?At [facility], you and your health needs are our priority.  As part of our continuing mission to provide you with exceptional heart care, we have created designated Provider Care Teams.  These Care Teams include your primary Cardiologist (physician) and Advanced Practice Providers (APPs -  Physician Assistants and Nurse Practitioners) who all work together to provide you  with the care you need, when you need it. ? ?We recommend signing up for the patient portal called "MyChart".  Sign up information is provided on this After Visit Summary.  MyChart is used to connect with patients for Virtual Visits (Telemedicine).  Patients are able to view lab/test results, encounter notes, upcoming appointments, etc.  Non-urgent messages can be sent to your provider as well.   ?To learn more about what you can do with MyChart, go to [email or web address].   ? ?Your next appointment:   ?3 month(s) ? ?The format for your next appointment:   ?In Person ? ?Provider:   ?Sanda Klein, MD { ? ? ?Important Information About Sugar ? ? ? ? ? ? ?

## 2021-06-25 NOTE — Progress Notes (Signed)
?Cardiology Office Note:   ? ?Date:  06/27/2021  ? ?ID:  Deanna Dorsey, DOB November 02, 1955, MRN [id number] ? ?PCP:  Lanelle Bal, PA-C ?  ?[facility] Providers ?Cardiologist:  Sanda Klein, MD    ? ?Referring MD: Lanelle Bal, PA-C  ? ?Chief Complaint  ?Patient presents with  ? Shortness of Breath  ?   ?  ? Consult  ?Deanna Dorsey is a 66 y.o. female who is being seen today for the evaluation of dyspnea on exertion and fatigue at the request of Lanelle Bal, [place]. ? ? ?History of Present Illness:   ? ?Deanna Dorsey is a 66 y.o. female with a hx of generally good health, recently troubled by a lot of issues with shortness of breath and fatigue. ? ?She has well-controlled hypertension.  She has a history of obesity and underwent gastric sleeve surgery in the remote past.  She has lost a lot of weight.  She had obstructive sleep apnea but this has resolved with weight loss.  She does not have a history of smoking, diabetes, clinically proven coronary or peripheral arterial disease, but has been diagnosed with atherosclerosis of the aorta and coronaries based on imaging studies. ? ?She has had COVID-19 twice in the last couple of years.  She received her vaccine in February 2022 and 2 days later began experiencing chest pain, palpitations and shortness of breath.  She was prescribed an inhaler by her pulmonologist, but subsequent pulmonary function test showed normal values and the inhaler was stopped.  Last year she underwent 3 separate surgical procedures: for breast mass (benign), left knee replacement in July and redo right knee replacement in November.  In January 2023 she had an upper respiratory tract infection that was followed by profound and extreme fatigue which still persists. ? ?She has also had a lot of emotional stressors.  Her husband died in September 6861 from complications of COVID.  Her sister had a protracted course of leukemia [place] got her COVID shot in order to be able to visit her while in  the hospital in 2022), she also recently passed away.  ? ?Deanna Dorsey recently underwent EGD and colonoscopy with benign findings except for the presence of a very large hiatal hernia (her gastroenterologist told her it was 11 cm in size).  A CT of the chest recently performed confirms the hiatal hernia, although describes it as being small.  There is evidence of mild aortic atherosclerosis and mild left coronary and circumflex coronary artery calcifications. ? ?Important to note that our patient had cardiac catheterization in [place] in December 2019 and was told that she had a "beautiful heart".  The catheterization was performed after an echocardiogram raised concerns for possible pulmonary artery hypertension, which apparently was also not present.  Recent labs showed mild hypercholesterolemia 229, LDL 113 but also an excellent HDL at 83. ? ?ECG today shows normal sinus rhythm and is completely normal. ? ?Past Medical History:  ?Diagnosis Date  ? Arthritis   ? Asthma 07/17/2020  ? vaccine triggered,  allergy triggered asthma  ? Family history of adverse reaction to anesthesia   ? sister slow to wake up  ? History of kidney stones   ? 1982  ? Pneumonia   ? PONV (postoperative nausea and vomiting)   ? Skin cancer   ? legs and face  ? Sleep apnea 2003  ? had surgical fix  ? ? ?Past Surgical History:  ?Procedure Laterality Date  ? ABDOMINAL HYSTERECTOMY  2006  ?  BACK SURGERY    ? 2016   cervical  ? COLONOSCOPY WITH PROPOFOL N/A 06/11/2021  ? Procedure: COLONOSCOPY WITH PROPOFOL;  Surgeon: Harvel Quale, MD;  [facility];  Service: Gastroenterology;  Laterality: N/A;  ? ESOPHAGOGASTRODUODENOSCOPY (EGD) WITH PROPOFOL N/A 06/11/2021  ? Procedure: ESOPHAGOGASTRODUODENOSCOPY (EGD) WITH PROPOFOL;  Surgeon: Harvel Quale, MD;  [facility];  Service: Gastroenterology;  Laterality: N/A;  105  ? KNEE ARTHROTOMY Right 02/06/2019  ? Procedure: KNEE ARTHROTOMY; SCAR EXCISION;  Surgeon:  Gaynelle Arabian, MD;  Location: [facility];  Service: Orthopedics;  Laterality: Right;  63mn  ? KNEE CARTILAGE SURGERY Right 2006  ? MENISCUS REPAIR Right 2016  ? NASAL SINUS SURGERY  2004  ? RADIOACTIVE SEED GUIDED EXCISIONAL BREAST BIOPSY Left 08/25/2020  ? Procedure: RADIOACTIVE SEED GUIDED EXCISIONAL LEFT BREAST BIOPSY;  Surgeon: WRolm Bookbinder MD;  Location: [facility]  Service: General;  Laterality: Left;  ? REPLACEMENT TOTAL KNEE Right 2019  ? TONSILLECTOMY    ? 2004  ? TOTAL KNEE ARTHROPLASTY Left 09/16/2020  ? Procedure: LEFT TOTAL KNEE ARTHROPLASTY;  Surgeon: AGaynelle Arabian MD;  Location: [facility];  Service: Orthopedics;  Laterality: Left;  ? TOTAL KNEE REVISION Right 01/27/2021  ? Procedure: TOTAL KNEE REVISION;  Surgeon: AGaynelle Arabian MD;  Location: [facility];  Service: Orthopedics;  Laterality: Right;  ? ? ?Current Medications: ?Current Meds  ?Medication Sig  ? atorvastatin (LIPITOR) 10 MG tablet Take 10 mg by mouth daily.  ? Cholecalciferol (VITAMIN D) 125 MCG (5000 UT) CAPS Take 5,000 Units by mouth daily.  ? Cyanocobalamin (B-12 PO) Take 1 tablet by mouth daily.  ? fluticasone (FLONASE) 50 MCG/ACT nasal spray Place 1 spray into both nostrils daily.  ? Garlic (GARLIQUE PO) Take 1 tablet by mouth daily.  ? losartan-hydrochlorothiazide (HYZAAR) 50-12.5 MG tablet Take 1 tablet by mouth daily.  ? montelukast (SINGULAIR) 10 MG tablet Take 1 tablet (10 mg total) by mouth at bedtime.  ? NONFORMULARY OR COMPOUNDED ITEM Take 10 mcg by mouth daily. T3  ? OVER THE COUNTER MEDICATION Balance of Nature - Fruits and Veggies  ? progesterone (PROMETRIUM) 100 MG capsule Take 100 mg by mouth at bedtime.  ? Red Yeast Rice Extract (RED YEAST RICE PO) Take 1 capsule by mouth daily.  ?  ? ?Allergies:   Doxycycline  ? ?Social History  ? ?Socioeconomic History  ? Marital status: Widowed  ?  Spouse name: Not on file  ? Number of children: Not on file  ? Years of education: Not on file  ? Highest education  level: Not on file  ?Occupational History  ? Not on file  ?Tobacco Use  ? Smoking status: Never  ? Smokeless tobacco: Never  ?Vaping Use  ? Vaping Use: Never used  ?Substance and Sexual Activity  ? Alcohol use: Not Currently  ?  Alcohol/week: 1.0 standard drink  ?  Types: 1 Glasses of wine per week  ?  Comment: rare  ? Drug use: Never  ? Sexual activity: Not Currently  ?  Birth control/protection: Surgical  ?Other Topics Concern  ? Not on file  ?Social History Narrative  ? Not on file  ? ?Social Determinants of Health  ? ?Financial Resource Strain: Not on file  ?Food Insecurity: Not on file  ?Transportation Needs: Not on file  ?Physical Activity: Not on file  ?Stress: Not on file  ?Social Connections: Not on file  ?  ? ?Family History: ?The patient's family  history significant negative for early onset coronary artery disease or other cardiac illnesses ? ?ROS:   ?Please see the history of present illness.    ? All other systems reviewed and are negative. ? ?EKGs/Labs/Other Studies Reviewed:   ? ?The following studies were reviewed today: ?CT chest 06/18/2021 ? ?EKG:  EKG is  ordered today.  The ekg ordered today demonstrates normal sinus rhythm, normal tracing ? ?Recent Labs: ?01/19/2021: ALT 15 ?01/28/2021: BUN 7; Creatinine, Ser 0.48; Hemoglobin 10.8; Platelets 229; Potassium 4.8; Sodium 136  ?06/03/2021 ?Hemoglobin 13.9, creatinine 0.67, ALT 10, TSH 2.06 ?Hemoglobin A1c 5.1% ?Recent Lipid Panel ?No results found for: CHOL, TRIG, HDL, CHOLHDL, VLDL, LDLCALC, LDLDIRECT ?06/03/2021 ?Cholesterol 229, HDL 83, LDL 131, triglycerides 86 ? ?Risk Assessment/Calculations:   ?  ? ?    ? ?Physical Exam:   ? ?VS:  BP 102/76 (BP Location: Left Arm)   Pulse 77   Ht '5\' 3"'$  (1.6 m)   Wt 137 lb (62.1 kg)   BMI 24.27 kg/m?    ? ?Wt Readings from Last 3 Encounters:  ?06/25/21 137 lb (62.1 kg)  ?06/11/21 130 lb (59 kg)  ?05/10/21 135 lb 8 oz (61.5 kg)  ?  ? ?GEN: Appears lean, fit and younger than stated age well nourished, well  developed in no acute distress ?HEENT: Normal ?NECK: No JVD; No carotid bruits ?LYMPHATICS: No lymphadenopathy ?CARDIAC: RRR, no murmurs, rubs, gallops ?RESPIRATORY:  Clear to auscultation without rales, wheezing or rh

## 2021-06-29 ENCOUNTER — Telehealth (HOSPITAL_COMMUNITY): Payer: Self-pay | Admitting: *Deleted

## 2021-06-29 ENCOUNTER — Ambulatory Visit (INDEPENDENT_AMBULATORY_CARE_PROVIDER_SITE_OTHER): Payer: Medicare Other

## 2021-06-29 DIAGNOSIS — R0602 Shortness of breath: Secondary | ICD-10-CM

## 2021-06-29 LAB — ECHOCARDIOGRAM COMPLETE
AR max vel: 2 cm2
AV Area VTI: 2.09 cm2
AV Area mean vel: 1.95 cm2
AV Mean grad: 6 mmHg
AV Peak grad: 12.1 mmHg
Ao pk vel: 1.74 m/s
Area-P 1/2: 2.52 cm2
Calc EF: 73.1 %
Single Plane A2C EF: 76.4 %
Single Plane A4C EF: 71.5 %

## 2021-06-29 NOTE — Telephone Encounter (Signed)
Close encounter 

## 2021-06-30 ENCOUNTER — Telehealth: Payer: Self-pay | Admitting: Cardiovascular Disease

## 2021-06-30 ENCOUNTER — Ambulatory Visit (HOSPITAL_COMMUNITY)
Admission: RE | Admit: 2021-06-30 | Discharge: 2021-06-30 | Disposition: A | Discharge status: Home / Self Care | Payer: Medicare Other | Source: Ambulatory Visit | Attending: Cardiovascular Disease | Admitting: Cardiovascular Disease

## 2021-06-30 DIAGNOSIS — R0602 Shortness of breath: Secondary | ICD-10-CM | POA: Diagnosis present

## 2021-06-30 LAB — EXERCISE TOLERANCE TEST
Angina Index: 0
Duke Treadmill Score: 1
Estimated workload: 7
Exercise duration (min): 5 min
Exercise duration (sec): 47 s
MPHR: 153 {beats}/min
Peak HR: 139 {beats}/min
Percent HR: 90 %
Rest HR: 68 {beats}/min
ST Depression (mm): 1 mm

## 2021-06-30 NOTE — Telephone Encounter (Signed)
? ?  Pt said, would like to ask Dr. Loletha Grayer nurse can request her records for her heart cath at her cards in [place]. She gave info: ?Dr. Tonye Royalty  ?[facility] ?phone# [phone] ? ?She called and they will be mailing her a copy but she wanted to check in if Dr. Loletha Grayer can request it and can get it sooner  ?

## 2021-06-30 NOTE — Telephone Encounter (Signed)
Spoke with pt regarding records needed from [place]. Pt states that her cath was done at [facility] in [place], [place]. Pt states if any additional info is needed to give her a call. Will route to primary nurse to f/u. ?

## 2021-07-01 NOTE — Telephone Encounter (Signed)
Patient signed a medical release form. This will be faxed for records ?

## 2021-07-02 ENCOUNTER — Telehealth (INDEPENDENT_AMBULATORY_CARE_PROVIDER_SITE_OTHER): Payer: Self-pay

## 2021-07-02 ENCOUNTER — Other Ambulatory Visit (INDEPENDENT_AMBULATORY_CARE_PROVIDER_SITE_OTHER): Payer: Self-pay | Admitting: Gastroenterology

## 2021-07-02 DIAGNOSIS — K219 Gastro-esophageal reflux disease without esophagitis: Secondary | ICD-10-CM

## 2021-07-02 MED ORDER — PANTOPRAZOLE SODIUM 40 MG PO TBEC: 40.0000 mg | DELAYED_RELEASE_TABLET | Freq: Every day | ORAL | 3 refills | Status: DC

## 2021-07-02 NOTE — Telephone Encounter (Signed)
Medication sent to pharmacy.  ?Thanks for the update, needs to follow up closely with cardiology to rule out the ischemia ?

## 2021-07-02 NOTE — Telephone Encounter (Signed)
Patient called today asking if you would resume her on Pantoprazole 40 mg once per day? She says after her recent EGD she was told to stop it. She says since stopping she has noticed her cough has gotten worse and she feels it was much better on the ppi. If so she would like prescription sent to [facility]. ? ?She also wanted to update you regarding her recent Cardiology appointment. She says she had a echo and stress test that showed Ischemia. Caridiologist is working on getting her set up for a angiogram and a cath as soon as they get her records from [place].  ?

## 2021-07-05 ENCOUNTER — Telehealth: Payer: Self-pay | Admitting: Cardiovascular Disease

## 2021-07-05 DIAGNOSIS — R0789 Other chest pain: Secondary | ICD-10-CM

## 2021-07-05 DIAGNOSIS — R072 Precordial pain: Secondary | ICD-10-CM

## 2021-07-05 DIAGNOSIS — R0602 Shortness of breath: Secondary | ICD-10-CM

## 2021-07-05 NOTE — Telephone Encounter (Signed)
?  Patient is following up to see if we got her records from [place] and if Dr Sallyanne Kuster wants to do an angiogram.  ?

## 2021-07-05 NOTE — Telephone Encounter (Signed)
Spoke with pt to let her know that records have not been received yet. Per primary nurse records were requested on Thursday of last week. Advised pt that we usually wait a few days to a week before re-requesting records. Pt states that she feels about the same, maybe a little worst. She states that she is trying to stay at home and take it easy because any activity gets her short of breath. Told pt that we would be in touch with steps moving forward. Pt verbalizes understanding.  ?

## 2021-07-05 NOTE — Telephone Encounter (Signed)
Patient aware of all.

## 2021-07-07 NOTE — Telephone Encounter (Signed)
Returned the call to the patient. She has agreed to have a coronary ct.  ? ?She stated that her blood pressure normally runs in the low 280 systolic and heart rates in the 60-70s. ? ?Instructions for the CT will be sent through [facility].  ? ? ?

## 2021-07-07 NOTE — Telephone Encounter (Signed)
Patient is returning call.  °

## 2021-07-07 NOTE — Telephone Encounter (Signed)
Attempted to reach the patient. Was unable to leave a message. ? ?Per Dr. Sallyanne Kuster, he would recommend a coronary CT. ?

## 2021-07-08 ENCOUNTER — Encounter: Payer: Self-pay | Admitting: Cardiovascular Disease

## 2021-07-08 MED ORDER — METOPROLOL TARTRATE 50 MG PO TABS: ORAL_TABLET | ORAL | 0 refills | Status: DC

## 2021-07-08 NOTE — Telephone Encounter (Signed)
Pt aware of Metoprolol  strength and [facility] [place] ?

## 2021-07-08 NOTE — Telephone Encounter (Signed)
Metoprolol tartrate 50 mg for CTA please ?

## 2021-07-08 NOTE — Telephone Encounter (Signed)
Patient is calling back with questions. Please advise  

## 2021-07-13 LAB — BASIC METABOLIC PANEL
BUN/Creatinine Ratio: 17 (ref 12–28)
BUN: 11 mg/dL (ref 8–27)
CO2: 25 mmol/L (ref 20–29)
Calcium: 9.9 mg/dL (ref 8.7–10.3)
Chloride: 94 mmol/L — ABNORMAL LOW (ref 96–106)
Creatinine, Ser: 0.63 mg/dL (ref 0.57–1.00)
Glucose: 82 mg/dL (ref 70–99)
Potassium: 4.2 mmol/L (ref 3.5–5.2)
Sodium: 137 mmol/L (ref 134–144)
eGFR: 97 mL/min/{1.73_m2} (ref >59)

## 2021-07-19 ENCOUNTER — Telehealth (HOSPITAL_COMMUNITY): Payer: Self-pay | Admitting: Emergency Medicine

## 2021-07-19 NOTE — Telephone Encounter (Signed)
Reaching out to patient to offer assistance regarding upcoming cardiac imaging study; pt verbalizes understanding of appt date/time, parking situation and where to check in, pre-test NPO status and medications ordered, and verified current allergies; name and call back number provided for further questions should they arise ?Marchia Bond RN Navigator Cardiac Imaging ?[facility] ?[phone] office ?[phone] cell ? ?Difficult IV ?'50mg'$  metoprolol tart ?Arrival 900 ?

## 2021-07-20 ENCOUNTER — Ambulatory Visit (HOSPITAL_COMMUNITY)
Admission: RE | Admit: 2021-07-20 | Discharge: 2021-07-20 | Disposition: A | Discharge status: Home / Self Care | Payer: Medicare Other | Source: Ambulatory Visit | Attending: Cardiovascular Disease | Admitting: Cardiovascular Disease

## 2021-07-20 DIAGNOSIS — R072 Precordial pain: Secondary | ICD-10-CM | POA: Insufficient documentation

## 2021-07-20 DIAGNOSIS — R0789 Other chest pain: Secondary | ICD-10-CM | POA: Diagnosis present

## 2021-07-20 DIAGNOSIS — R0602 Shortness of breath: Secondary | ICD-10-CM | POA: Diagnosis present

## 2021-07-20 MED ORDER — NITROGLYCERIN 0.4 MG SL SUBL
SUBLINGUAL_TABLET | SUBLINGUAL | Status: AC
  Filled 2021-07-20: qty 2

## 2021-07-20 MED ORDER — IOHEXOL 350 MG/ML SOLN
100.0000 mL | Freq: Once | INTRAVENOUS | Status: AC | PRN
  Administered 2021-07-20: 100 mL via INTRAVENOUS

## 2021-07-20 MED ORDER — NITROGLYCERIN 0.4 MG SL SUBL
0.8000 mg | SUBLINGUAL_TABLET | Freq: Once | SUBLINGUAL | Status: AC
  Administered 2021-07-20: 0.8 mg via SUBLINGUAL

## 2021-07-22 ENCOUNTER — Other Ambulatory Visit: Payer: Self-pay | Admitting: *Deleted

## 2021-07-22 DIAGNOSIS — E7800 Pure hypercholesterolemia, unspecified: Secondary | ICD-10-CM

## 2021-07-22 MED ORDER — ATORVASTATIN CALCIUM 20 MG PO TABS: 20.0000 mg | ORAL_TABLET | Freq: Every day | ORAL | 3 refills | Status: AC

## 2021-08-10 ENCOUNTER — Encounter (INDEPENDENT_AMBULATORY_CARE_PROVIDER_SITE_OTHER): Payer: Self-pay | Admitting: Gastroenterology

## 2021-08-10 ENCOUNTER — Ambulatory Visit (INDEPENDENT_AMBULATORY_CARE_PROVIDER_SITE_OTHER): Payer: Medicare Other | Admitting: Gastroenterology

## 2021-08-10 VITALS — BP 135/75 | HR 67 | Temp 98.1°F | Ht 63.0 in | Wt 143.7 lb

## 2021-08-10 DIAGNOSIS — R0989 Other specified symptoms and signs involving the circulatory and respiratory systems: Secondary | ICD-10-CM | POA: Diagnosis not present

## 2021-08-10 DIAGNOSIS — R6881 Early satiety: Secondary | ICD-10-CM | POA: Diagnosis not present

## 2021-08-10 MED ORDER — SUCRALFATE 1 G PO TABS: 1.0000 g | ORAL_TABLET | Freq: Four times a day (QID) | ORAL | 0 refills | Status: DC

## 2021-08-10 NOTE — Patient Instructions (Signed)
At this time I would recommend stopping protonix as this does not seem to be providing any results for you. I have sent carafate 1g tablets to mail in pharmacy, as discussed, you can start taking one tablet upon waking in the morning and before bedtime, if you feel that you need to repeat the dose during the day, you can do this as well, as long as you do not exceed more than 4 doses per day. Please dissolve the tablet in 29m of water, mix and drink, try this for 1-2 weeks, if you do not notice any improvement with the sensation in your throat, you can stop the medication. I agree that a referral to ENT by your PCP is a good idea, and I would also discuss having your PCP check for autoimmune diseases given your pain and fatigue.   Please call me with an update after you take carafate/have your ENT referral and we can determine your next follow up with [place]   If you decide that you want to pursue pH impedence testing, please let me know

## 2021-08-10 NOTE — Progress Notes (Signed)
Referring Provider: Lanelle Bal, PA-C Primary Care Physician:  Lanelle Bal, PA-C Primary GI Physician: Jenetta Downer  Chief Complaint  Patient presents with   Follow-up    Patient here today for a follow up. She is still experiencing the feeling of something in the back of her throat. She still has some issues with shortness of breath. She is taking pantoprazole 40 mg daily.     HPI:   Deanna Dorsey is a 66 y.o. female with past medical history of arthritis, asthma, sleep apnea (resolved with previous surgery), previous sleeve gastrectomy (many years ago, per patient)   Patient presenting today for follow up of globus sensation.  History: last seen 05/10/21, at that time, patient reported COVID vaccine last year w/some adverse reactions since, she ended up needing to see a pulmonologist for respiratory issues and had elevated BP since then as well. Reportedly lost about 33 lbs since she got the vaccine early last year. c/o globus sensation, despite having tried flonase and the navage nasal irrigation unit. She had been on nexium for atleast 4-5 months without any change in symptoms. Denied issues with dysphagia. Very rare acid reflux symptoms and denied hoarseness or ongoing sore throat. She also endorsed some lower abdominal pain, usually after eating meat, seafood or dairy, sometimes with associated nausea. Pt scheduled for EGD/colonoscopy, started on protonix, subsequently, she was advised to stop PPI after EGD as she had no improvement in symptoms. She also underwent CT chest w contrast due to size of hiatal hernia found on EGD, as outline below.   Present: She states that she continues with a cough and SOB, she is on tessalon pearls and an inhaler. She had recently started back on her PPI as she was unsure why Dr. Jenetta Downer wanted her to stop it, however, she feels that it is not providing any relief for her. She was released from her pulmonologist as they did not feel that there were any  underlying pulmonary issues causing her symptoms. She denies any acid reflux or heartburn symptoms. She reports that she has an almost chronic sore throat and hoarseness with hoarsness worse in the morning. She reports that on occasion she has eaten something maybe spicy that she felt made her globus sensation worse and she had to cough up some phlegm afterwards. Does not feel that drinking anything makes sensation better. She denies dysphagia. She keeps HOB elevated at night. She is still being followed by Cardiology, though they also felt that her symptoms were unrelated to cardiac issues either. Patient also reports that she is feeling very fatigued and having pain in her legs and feet recently. She did stop one of her allergy medications which seemed to help some with the fatigue. She states that she has had recent labs done, she is on natural hormone replacement and was told that her thyroid function was low normal so her doctor started her on a thyroid medication. She had Vit B12 and D levels checked with PCP recently but these were normal. She has repeat labs in late June. PCP plans to refer patient to ENT for further evaluation of globus sensation.   No red flag symptoms. Patient denies melena, hematochezia, nausea, vomiting, diarrhea, constipation, dysphagia, odyonophagia, early satiety or weight loss.   CT Chest w contrast: No acute findings in the chest. Mild 2 vessel coronary artery calcifications. Aortic atherosclerosis. 3. Previous gastric surgery, incompletely imaged. Small hiatal hernia. (Recommended stopping PPI and pH impedence testing if symptoms persisted) Last Colonoscopy:06/11/21 Diverticulosis  in the descending colon, in the transverse colon and in the ascending colon. - The distal rectum and anal verge are normal on retroflexion view. - No specimens collected. Last Endoscopy: 06/11/21 - 11cm hiatal hernia. - Normal stomach. - Normal examined duodenum. - No specimens  collected.  Recommendations:  Repeat colonoscopy in march 2033  Past Medical History:  Diagnosis Date   Arthritis    Asthma 07/17/2020   vaccine triggered,  allergy triggered asthma   Family history of adverse reaction to anesthesia    sister slow to wake up   History of kidney stones    1982   Pneumonia    PONV (postoperative nausea and vomiting)    Skin cancer    legs and face   Sleep apnea 2003   had surgical fix    Past Surgical History:  Procedure Laterality Date   ABDOMINAL HYSTERECTOMY  2006   BACK SURGERY     2016   cervical   COLONOSCOPY WITH PROPOFOL N/A 06/11/2021   Procedure: COLONOSCOPY WITH PROPOFOL;  Surgeon: Harvel Quale, MD;  Location: [facility];  Service: Gastroenterology;  Laterality: N/A;   ESOPHAGOGASTRODUODENOSCOPY (EGD) WITH PROPOFOL N/A 06/11/2021   Procedure: ESOPHAGOGASTRODUODENOSCOPY (EGD) WITH PROPOFOL;  Surgeon: Harvel Quale, MD;  Location: [facility];  Service: Gastroenterology;  Laterality: N/A;  105   KNEE ARTHROTOMY Right 02/06/2019   Procedure: KNEE ARTHROTOMY; SCAR EXCISION;  Surgeon: Gaynelle Arabian, MD;  Location: [facility];  Service: Orthopedics;  Laterality: Right;  3mn   KNEE CARTILAGE SURGERY Right 2006   MENISCUS REPAIR Right 2016   NASAL SINUS SURGERY  2004   RADIOACTIVE SEED GUIDED EXCISIONAL BREAST BIOPSY Left 08/25/2020   Procedure: RADIOACTIVE SEED GUIDED EXCISIONAL LEFT BREAST BIOPSY;  Surgeon: WRolm Bookbinder MD;  Location: [facility]  Service: General;  Laterality: Left;   REPLACEMENT TOTAL KNEE Right 2019   TONSILLECTOMY     2004   TOTAL KNEE ARTHROPLASTY Left 09/16/2020   Procedure: LEFT TOTAL KNEE ARTHROPLASTY;  Surgeon: AGaynelle Arabian MD;  Location: [facility];  Service: Orthopedics;  Laterality: Left;   TOTAL KNEE REVISION Right 01/27/2021   Procedure: TOTAL KNEE REVISION;  Surgeon: AGaynelle Arabian MD;  Location: [facility];  Service: Orthopedics;  Laterality: Right;     Current Outpatient Medications  Medication Sig Dispense Refill   atorvastatin (LIPITOR) 20 MG tablet Take 1 tablet (20 mg total) by mouth daily. 90 tablet 3   Cholecalciferol (VITAMIN D) 125 MCG (5000 UT) CAPS Take 5,000 Units by mouth daily.     fluticasone (FLONASE) 50 MCG/ACT nasal spray Place 1 spray into both nostrils daily. 16 g 2   Garlic (GARLIQUE PO) Take 1 tablet by mouth daily.     losartan-hydrochlorothiazide (HYZAAR) 50-12.5 MG tablet Take 0.5 tablets by mouth daily. 1/2 tablet daily per patient.     montelukast (SINGULAIR) 10 MG tablet Take 1 tablet (10 mg total) by mouth at bedtime. 30 tablet 11   NONFORMULARY OR COMPOUNDED ITEM Take 10 mcg by mouth daily. T3     OVER THE COUNTER MEDICATION Balance of Nature - Fruits and Veggies     pantoprazole (PROTONIX) 40 MG tablet Take 1 tablet (40 mg total) by mouth daily. 90 tablet 3   progesterone (PROMETRIUM) 100 MG capsule Take 100 mg by mouth at bedtime.     Red Yeast Rice Extract (RED YEAST RICE PO) Take 1 capsule by mouth daily.     No current facility-administered medications for this  visit.    Allergies as of 08/10/2021 - Review Complete 08/10/2021  Allergen Reaction Noted   Doxycycline Anaphylaxis and Rash 01/28/2019    History reviewed. No pertinent family history.  Social History   Socioeconomic History   Marital status: Widowed    Spouse name: Not on file   Number of children: Not on file   Years of education: Not on file   Highest education level: Not on file  Occupational History   Not on file  Tobacco Use   Smoking status: Never   Smokeless tobacco: Never  Vaping Use   Vaping Use: Never used  Substance and Sexual Activity   Alcohol use: Not Currently    Alcohol/week: 1.0 standard drink    Types: 1 Glasses of wine per week    Comment: rare   Drug use: Never   Sexual activity: Not Currently    Birth control/protection: Surgical  Other Topics Concern   Not on file  Social History Narrative    Not on file   Social Determinants of Health   Financial Resource Strain: Not on file  Food Insecurity: Not on file  Transportation Needs: Not on file  Physical Activity: Not on file  Stress: Not on file  Social Connections: Not on file   Review of systems General: negative for  night sweats, fever, chills, weight loss +fatigue Neck: Negative for lumps, goiter, pain and significant neck swelling Resp: Negative for cough, wheezing, dyspnea at rest CV: Negative for chest pain, leg swelling, palpitations, orthopnea GI: denies melena, hematochezia, nausea, vomiting, diarrhea, constipation, dysphagia, odyonophagia, early satiety or unintentional weight loss. +globus sensation MSK: Negative joint swelling +joint stiffness +leg/foot pain Derm: Negative for itching +occasional erythematous rash on her feet Psych: Denies depression, anxiety, memory loss, confusion. No homicidal or suicidal ideation.  Heme: Negative for prolonged bleeding, bruising easily, and swollen nodes. Endocrine: Negative for cold or heat intolerance, polyuria, polydipsia and goiter. Neuro: negative for tremor, gait imbalance, syncope and seizures. The remainder of the review of systems is noncontributory.  Physical Exam: BP 135/75 (BP Location: Left Arm, Patient Position: Sitting, Cuff Size: Large)   Pulse 67   Temp 98.1 F (36.7 C) (Oral)   Ht 5' 3" (1.6 m)   Wt 143 lb 11.2 oz (65.2 kg)   BMI 25.46 kg/m  General:   Alert and oriented. No distress noted. Pleasant and cooperative.  Head:  Normocephalic and atraumatic. Eyes:  Conjuctiva clear without scleral icterus. Mouth:  Oral mucosa pink and moist. Good dentition. No lesions. Heart: Normal rate and rhythm, s1 and s2 heart sounds present.  Lungs: Clear lung sounds in all lobes. Respirations equal and unlabored. Abdomen:  +BS, soft, non-tender and non-distended. No rebound or guarding. No HSM or masses noted. Derm: No palmar erythema or jaundice Msk:   Symmetrical without gross deformities. Normal posture. Extremities:  Without edema. Neurologic:  Alert and  oriented x4 Psych:  Alert and cooperative. Normal mood and affect.  Invalid input(s): 6 MONTHS   ASSESSMENT: Deanna Dorsey is a 66 y.o. female presenting today for follow up of globus sensation.  EGD done in march normal other than 11cm hiatal hernia, however, CT chest thereafter with only findings of small hiatal hernia and previous sleeve gastrectomy, suspected that hiatal hernia size was overestimated at the time of EGD, given no prior knowledge of previous sleeve gastrectomy, therefore unlikely symptoms related to her hh. She continues to have globus sensation, despite nexium previously and now pantoprazole daily. Has cough,  sore throat and hoarseness but denies actual GERD symptoms. Keeps HOB elevated. Denies dysphagia. Discussed pursing pH impedence testing for further evaluation of her symptoms, however, at this time patient declines as she does not feel that she could tolerate the procedure. PCP has plans to refer her to ENT for further evaluation which I think is reasonable at this time. Will trial course of carafate to see if this provides any relief of globus sensation as PPIs are not providing any results, she can stop protoni at this time. Should continue with reflux precautions as she is doing.  Given the myriad of symptoms patient is experiencing, to include fatigue, joint stiffness, a new intermittent rash and leg/foot pain, I did encourage patient to discuss autoimmune testing with her PCP as TSH, Vit D, Vit B12 and lymes disease have been ruled out as contributors to her symptoms.    PLAN:  Pt to make me aware if she wishes to proceed with pH impedence testing 2. Stop PPI 3. 1g carafate QID, trial x 1-2 weeks, can stop if no improvement in globus sensation 4. Agree with PCP referring to ENT 5. Pt to discuss autoimmune testing with PCP regarding fatigue/pain 6. Pt to call  with update after trial of carafate/ENT referral  All questions were answered, patient verbalized understanding and is in agreement with plan as outlined above.   Follow Up: To be determined  Leshaun Biebel L. Alver Sorrow, MSN, APRN, AGNP-C Adult-Gerontology Nurse Practitioner [facility]

## 2021-09-28 ENCOUNTER — Other Ambulatory Visit: Payer: Self-pay | Admitting: Pulmonary Disease

## 2021-10-07 ENCOUNTER — Ambulatory Visit (INDEPENDENT_AMBULATORY_CARE_PROVIDER_SITE_OTHER): Payer: Medicare Other | Admitting: Cardiovascular Disease

## 2021-10-07 ENCOUNTER — Encounter: Payer: Self-pay | Admitting: Cardiovascular Disease

## 2021-10-07 VITALS — BP 138/78 | HR 68 | Ht 63.0 in | Wt 143.6 lb

## 2021-10-07 DIAGNOSIS — E7800 Pure hypercholesterolemia, unspecified: Secondary | ICD-10-CM

## 2021-10-07 DIAGNOSIS — I10 Essential (primary) hypertension: Secondary | ICD-10-CM | POA: Diagnosis not present

## 2021-10-07 DIAGNOSIS — I700 Atherosclerosis of aorta: Secondary | ICD-10-CM

## 2021-10-07 DIAGNOSIS — R5383 Other fatigue: Secondary | ICD-10-CM | POA: Diagnosis not present

## 2021-10-07 DIAGNOSIS — R931 Abnormal findings on diagnostic imaging of heart and coronary circulation: Secondary | ICD-10-CM

## 2021-10-07 NOTE — Patient Instructions (Signed)
Medication Instructions:  No changes *If you need a refill on your cardiac medications before your next appointment, please call your pharmacy*   Lab Work: None ordered If you have labs (blood work) drawn today and your tests are completely normal, you will receive your results only by: Junior (if you have MyChart) OR A paper copy in the mail If you have any lab test that is abnormal or we need to change your treatment, we will call you to review the results.   Testing/Procedures: None ordered   Follow-Up: At [facility], you and your health needs are our priority.  As part of our continuing mission to provide you with exceptional heart care, we have created designated Provider Care Teams.  These Care Teams include your primary Cardiologist (physician) and Advanced Practice Providers (APPs -  Physician Assistants and Nurse Practitioners) who all work together to provide you with the care you need, when you need it.  We recommend signing up for the patient portal called "MyChart".  Sign up information is provided on this After Visit Summary.  MyChart is used to connect with patients for Virtual Visits (Telemedicine).  Patients are able to view lab/test results, encounter notes, upcoming appointments, etc.  Non-urgent messages can be sent to your provider as well.   To learn more about what you can do with MyChart, go to [email or web address].    Your next appointment:   Follow up as needed with Dr. Sallyanne Kuster  Important Information About Sugar

## 2021-10-07 NOTE — Progress Notes (Signed)
Cardiology Office Note:    Date:  10/07/2021   ID:  Deanna Dorsey, DOB 27-Jun-1955, MRN [id number]  PCP:  Lanelle Bal, PA-C   [facility] Providers Cardiologist:  Sanda Klein, MD     Referring MD: Lanelle Bal, PA-C   Chief Complaint  Patient presents with   Follow-up  Deanna Dorsey is a 66 y.o. female who is being seen today for the evaluation of dyspnea on exertion and fatigue at the request of Lanelle Bal, [place].   History of Present Illness:    Deanna Dorsey is a 66 y.o. female with a hx of mild coronary atherosclerosis and aortic atherosclerosis, otherwise generally good health, until she developed problems of shortness of breath and fatigue after a couple of acute respiratory illnesses.  She underwent several cardiac tests.  She had a normal echocardiogram.  On the treadmill stress test there was some borderline changes concerning for ischemia so she underwent a coronary CT angiogram.  There was no evidence of any meaningful coronary obstruction and the calcium score was 1 (70th percentile).  On statin therapy her LDL cholesterol is right around 100.  She is feeling much better.  She has improved stamina and reduced dyspnea.  She was able to have a very busy week with a friend visiting from [place] without feeling exhausted.  She started walking on the [place] in [place] and is building up to where she will be able to walk all the way to the waterfall.  She credits the improvement in her symptoms to a [place] supplement that she is taking.  She has well-controlled hypertension.  She has a history of obesity and underwent gastric sleeve surgery in the remote past.  She has lost a lot of weight.  She had obstructive sleep apnea but this has resolved with weight loss.  She does not have a history of smoking, diabetes, clinically proven coronary or peripheral arterial disease, but has been diagnosed with atherosclerosis of the aorta and coronaries based on imaging  studies.  She has had COVID-19 twice in the last couple of years.  She received her vaccine in February 2022 and 2 days later began experiencing chest pain, palpitations and shortness of breath.  She was prescribed an inhaler by her pulmonologist, but subsequent pulmonary function test showed normal values and the inhaler was stopped.  Last year she underwent 3 separate surgical procedures: for breast mass (benign), left knee replacement in July and redo right knee replacement in November.  In January 2023 she had an upper respiratory tract infection that was followed by profound and extreme fatigue which still persists.  She has also had a lot of emotional stressors.  Her husband died in September 4970 from complications of COVID.  Her sister had a protracted course of leukemia C[place]ak got her COVID shot in order to be able to visit her while in the hospital in 2022), she also recently passed away.   Deanna Dorsey recently underwent EGD and colonoscopy with benign findings except for the presence of a very large hiatal hernia (her gastroenterologist told her it was 11 cm in size).  A CT of the chest recently performed confirms the hiatal hernia, although describes it as being small.  There is evidence of mild aortic atherosclerosis and mild left coronary and circumflex coronary artery calcifications.  Important to note that our patient had cardiac catheterization in [place] in December 2019 and was told that she had a "beautiful heart".  The catheterization was performed after an  echocardiogram raised concerns for possible pulmonary artery hypertension, which apparently was also not present.  Recent labs showed mild hypercholesterolemia 229, LDL 113 but also an excellent HDL at 83.   Past Medical History:  Diagnosis Date   Arthritis    Asthma 07/17/2020   vaccine triggered,  allergy triggered asthma   Family history of adverse reaction to anesthesia    sister slow to wake up   History of kidney stones     1982   Pneumonia    PONV (postoperative nausea and vomiting)    Skin cancer    legs and face   Sleep apnea 2003   had surgical fix    Past Surgical History:  Procedure Laterality Date   ABDOMINAL HYSTERECTOMY  2006   BACK SURGERY     2016   cervical   COLONOSCOPY WITH PROPOFOL N/A 06/11/2021   Procedure: COLONOSCOPY WITH PROPOFOL;  Surgeon: Harvel Quale, MD;  [facility];  Service: Gastroenterology;  Laterality: N/A;   ESOPHAGOGASTRODUODENOSCOPY (EGD) WITH PROPOFOL N/A 06/11/2021   Procedure: ESOPHAGOGASTRODUODENOSCOPY (EGD) WITH PROPOFOL;  Surgeon: Harvel Quale, MD;  [facility];  Service: Gastroenterology;  Laterality: N/A;  105   KNEE ARTHROTOMY Right 02/06/2019   Procedure: KNEE ARTHROTOMY; SCAR EXCISION;  Surgeon: Gaynelle Arabian, MD;  Location: [facility];  Service: Orthopedics;  Laterality: Right;  60mn   KNEE CARTILAGE SURGERY Right 2006   MENISCUS REPAIR Right 2016   NASAL SINUS SURGERY  2004   RADIOACTIVE SEED GUIDED EXCISIONAL BREAST BIOPSY Left 08/25/2020   Procedure: RADIOACTIVE SEED GUIDED EXCISIONAL LEFT BREAST BIOPSY;  Surgeon: WRolm Bookbinder MD;  Location: [facility]  Service: General;  Laterality: Left;   REPLACEMENT TOTAL KNEE Right 2019   sleeve gastrectomy     TONSILLECTOMY     2004   TOTAL KNEE ARTHROPLASTY Left 09/16/2020   Procedure: LEFT TOTAL KNEE ARTHROPLASTY;  Surgeon: AGaynelle Arabian MD;  Location: [facility];  Service: Orthopedics;  Laterality: Left;   TOTAL KNEE REVISION Right 01/27/2021   Procedure: TOTAL KNEE REVISION;  Surgeon: AGaynelle Arabian MD;  Location: [facility];  Service: Orthopedics;  Laterality: Right;    Current Medications: Current Meds  Medication Sig   atorvastatin (LIPITOR) 20 MG tablet Take 1 tablet (20 mg total) by mouth daily.   Cholecalciferol (VITAMIN D) 125 MCG (5000 UT) CAPS Take 5,000 Units by mouth daily.   fluticasone (FLONASE) 50 MCG/ACT nasal spray SPRAY 1  SPRAY INTO BOTH NOSTRILS DAILY.   Garlic (GARLIQUE PO) Take 1 tablet by mouth daily.   HAWTHORNE PO Take 1 capsule by mouth daily in the afternoon.   losartan-hydrochlorothiazide (HYZAAR) 50-12.5 MG tablet Take 0.5 tablets by mouth daily. 1/2 tablet daily per patient.   montelukast (SINGULAIR) 10 MG tablet TAKE 1 TABLET BY MOUTH EVERYDAY AT BEDTIME   NONFORMULARY OR COMPOUNDED ITEM Take 10 mcg by mouth daily. T3   OVER THE COUNTER MEDICATION Balance of Nature - Fruits and Veggies   progesterone (PROMETRIUM) 100 MG capsule Take 100 mg by mouth at bedtime.   Red Yeast Rice Extract (RED YEAST RICE PO) Take 1 capsule by mouth daily.     Allergies:   Doxycycline   Social History   Socioeconomic History   Marital status: Widowed    Spouse name: Not on file   Number of children: Not on file   Years of education: Not on file   Highest education level: Not on file  Occupational History  Not on file  Tobacco Use   Smoking status: Never   Smokeless tobacco: Never  Vaping Use   Vaping Use: Never used  Substance and Sexual Activity   Alcohol use: Not Currently    Alcohol/week: 1.0 standard drink of alcohol    Types: 1 Glasses of wine per week    Comment: rare   Drug use: Never   Sexual activity: Not Currently    Birth control/protection: Surgical  Other Topics Concern   Not on file  Social History Narrative   Not on file   Social Determinants of Health   Financial Resource Strain: Not on file  Food Insecurity: Not on file  Transportation Needs: Not on file  Physical Activity: Not on file  Stress: Not on file  Social Connections: Not on file     Family History: The patient's family history significant negative for early onset coronary artery disease or other cardiac illnesses  ROS:   Please see the history of present illness.     All other systems reviewed and are negative.  EKGs/Labs/Other Studies Reviewed:    The following studies were reviewed today: CT chest  07/20/2021 1. Minimal CAD, CADRADS = 1. 2. Coronary calcium score is 51.2, which places the patient in the 70th percentile for age and sex matched control. 3. Normal coronary origins with co-dominance.   Echocardiogram 06/29/2021  1. Left ventricular ejection fraction, by estimation, is 65 to 70%. The  left ventricle has normal function. The left ventricle has no regional  wall motion abnormalities. Left ventricular diastolic parameters are  indeterminate.   2. Right ventricular systolic function is normal. The right ventricular  size is normal. There is normal pulmonary artery systolic pressure. The  estimated right ventricular systolic pressure is 37.6 mmHg.   3. The mitral valve is normal in structure. Trivial mitral valve  regurgitation. No evidence of mitral stenosis.   4. The aortic valve is tricuspid. Aortic valve regurgitation is not  visualized. No aortic stenosis is present.   5. The inferior vena cava is normal in size with greater than 50%  respiratory variability, suggesting right atrial pressure of 3 mmHg.   ECG treadmill stress test 06/30/2021   Exercise capacity was mildly impaired. Stage 2 was reached after exercising for 5 min and 47 sec. Maximum HR of 139 bpm. MPHR 90.0 %. Peak METS 7.0 . The test was stopped because the patient experienced dyspnea. Normal blood pressure and normal heart rate response noted during stress.   1.0 mm of horizontal ST depression in the inferior and lateral leads was noted. The ECG was positive for ischemia.   Prior study not available for comparison.   1 mm horizontal ST depression seen at peak exercise in inferior and lateral leads. Result is positive stress test. Occasional PACs and PVCs seen in both exercise and recovery.     EKG:  EKG is  ordered today.  The ekg ordered today demonstrates normal sinus rhythm, normal tracing  Recent Labs: 01/19/2021: ALT 15 01/28/2021: Hemoglobin 10.8; Platelets 229 07/12/2021: BUN 11; Creatinine,  Ser 0.63; Potassium 4.2; Sodium 137  06/03/2021 Hemoglobin 13.9, creatinine 0.67, ALT 10, TSH 2.06 Hemoglobin A1c 5.1% 09/07/2021 Hemoglobin 13.1, creatinine 0.69, potassium 4.8, ALT 14 Recent Lipid Panel No results found for: "CHOL", "TRIG", "HDL", "CHOLHDL", "VLDL", "LDLCALC", "LDLDIRECT" 06/03/2021 Cholesterol 229, HDL 83, LDL 131, triglycerides 86  09/07/2021 Cholesterol 199, HDL 75, LDL 102, triglycerides 62  Risk Assessment/Calculations:  Physical Exam:    VS:  BP 138/78 (BP Location: Left Arm, Patient Position: Sitting, Cuff Size: Normal)   Pulse 68   Ht '5\' 3"'$  (1.6 m)   Wt 143 lb 9.6 oz (65.1 kg)   SpO2 96%   BMI 25.44 kg/m     Wt Readings from Last 3 Encounters:  10/07/21 143 lb 9.6 oz (65.1 kg)  08/10/21 143 lb 11.2 oz (65.2 kg)  06/25/21 137 lb (62.1 kg)      General: Alert, oriented x3, no distress, appears well. Head: no evidence of trauma, PERRL, EOMI, no exophtalmos or lid lag, no myxedema, no xanthelasma; normal ears, nose and oropharynx Neck: normal jugular venous pulsations and no hepatojugular reflux; brisk carotid pulses without delay and no carotid bruits Chest: clear to auscultation, no signs of consolidation by percussion or palpation, normal fremitus, symmetrical and full respiratory excursions Cardiovascular: normal position and quality of the apical impulse, regular rhythm, normal first and second heart sounds, no murmurs, rubs or gallops Abdomen: no tenderness or distention, no masses by palpation, no abnormal pulsatility or arterial bruits, normal bowel sounds, no hepatosplenomegaly Extremities: no clubbing, cyanosis or edema; 2+ radial, ulnar and brachial pulses bilaterally; 2+ right femoral, posterior tibial and dorsalis pedis pulses; 2+ left femoral, posterior tibial and dorsalis pedis pulses; no subclavian or femoral bruits Neurological: grossly nonfocal Psych: Normal mood and affect   ASSESSMENT:    1. Elevated coronary artery  calcium score   2. Atherosclerosis of aorta (HCC)   3. Other fatigue   4. Essential hypertension   5. Hypercholesterolemia     PLAN:    In order of problems listed above:  Coronary calcification/aortic atherosclerosis : No meaningful obstructive lesions by invasive angiography in 2019 and coronary CT angiography in 2023, but does have a mildly elevated coronary calcium score.  It is important to treat her risk factors, but it is unlikely she will have major coronary problems in the near future.   Dyspnea on exertion/fatigue: Mildly abnormal exercise capacity on treadmill stress testing, but improving.  No evidence of any meaningful structural cardiac or coronary problems or lung abnormalities on coronary CT angiogram, echocardiogram, pulmonary function tests and chest CT.  Suspect some degree of deconditioning, postviral syndrome, may be even contribution of emotional factors.  She is steadily improving. HTN: Well-controlled on current medications. HLP: Target LDL less than 100, very close to goal.  Continue current medications.      Shared Decision Making/Informed Consent The risks [chest pain, shortness of breath, cardiac arrhythmias, dizziness, blood pressure fluctuations, myocardial infarction, stroke/transient ischemic attack, and life-threatening complications (estimated to be 1 in 10,000)], benefits (risk stratification, diagnosing coronary artery disease, treatment guidance) and alternatives of an exercise tolerance test were discussed in detail with Ms. Fifer and she agrees to proceed.    Medication Adjustments/Labs and Tests Ordered: Current medicines are reviewed at length with the patient today.  Concerns regarding medicines are outlined above.  No orders of the defined types were placed in this encounter.  No orders of the defined types were placed in this encounter.   Patient Instructions  Medication Instructions:  No changes *If you need a refill on your cardiac  medications before your next appointment, please call your pharmacy*   Lab Work: None ordered If you have labs (blood work) drawn today and your tests are completely normal, you will receive your results only by: [place] (if you have MyChart) OR A paper copy in the mail If you have  any lab test that is abnormal or we need to change your treatment, we will call you to review the results.   Testing/Procedures: None ordered   Follow-Up: At [facility], you and your health needs are our priority.  As part of our continuing mission to provide you with exceptional heart care, we have created designated Provider Care Teams.  These Care Teams include your primary Cardiologist (physician) and Advanced Practice Providers (APPs -  Physician Assistants and Nurse Practitioners) who all work together to provide you with the care you need, when you need it.  We recommend signing up for the patient portal called "MyChart".  Sign up information is provided on this After Visit Summary.  MyChart is used to connect with patients for Virtual Visits (Telemedicine).  Patients are able to view lab/test results, encounter notes, upcoming appointments, etc.  Non-urgent messages can be sent to your provider as well.   To learn more about what you can do with MyChart, go to [email or web address].    Your next appointment:   Follow up as needed with Dr. Sallyanne Kuster  Important Information About Sugar         Signed, Sanda Klein, MD  10/07/2021 10:40 AM    [place]

## 2021-11-30 ENCOUNTER — Other Ambulatory Visit: Payer: Self-pay | Admitting: Pulmonary Disease

## 2022-02-01 ENCOUNTER — Other Ambulatory Visit: Payer: Self-pay | Admitting: Pulmonary Disease

## 2022-09-10 IMAGING — CT CT CHEST W/ CM
2 of 8 series · 10 of 36 positions shown, 13 images · IV contrast (APPLIED)
Comparison: Chest radiograph, 06/17/2020.

CLINICAL DATA: Chest pressure and tightness.  Coughing.

EXAM:
CT CHEST WITH CONTRAST
TECHNIQUE: Multidetector CT imaging of the chest was performed during
intravenous contrast administration.

[Series 2: routine chest with · axial · 0.59mm/px · z∈[-317,-109]mm · 9 of 130 slices shown, 12 images]
[im 13/130  mediastinal]
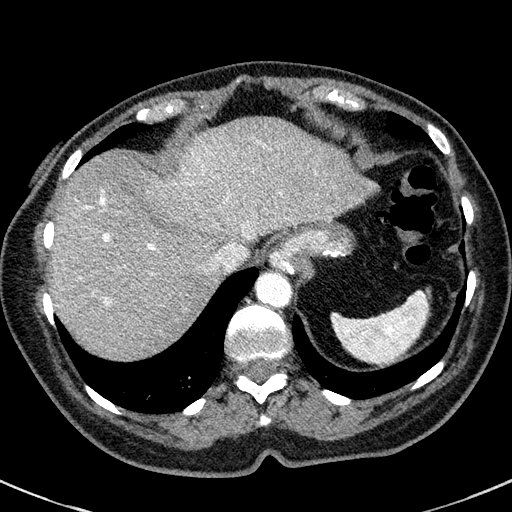
[im 13/130  lung]
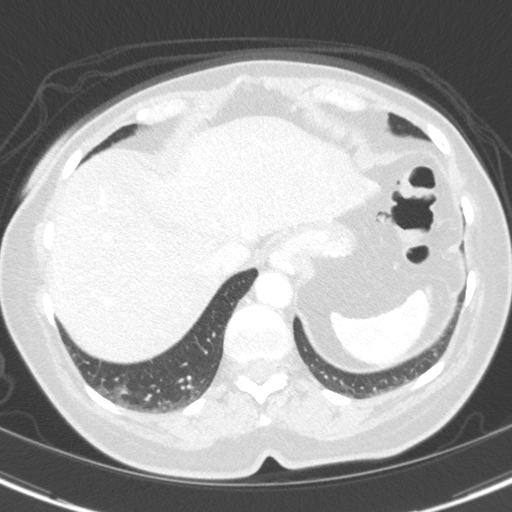
[im 26/130  lung]
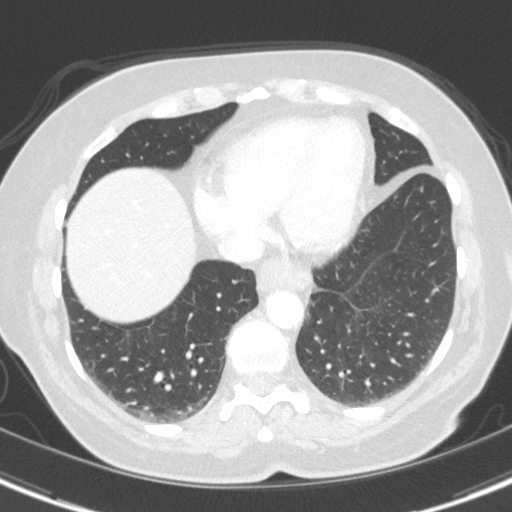
[im 39/130  lung]
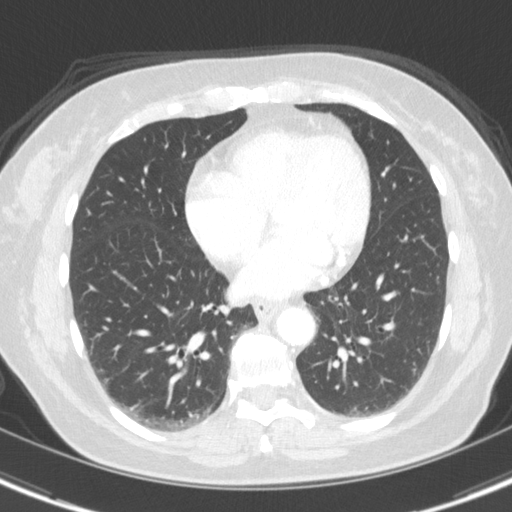
[im 52/130  lung]
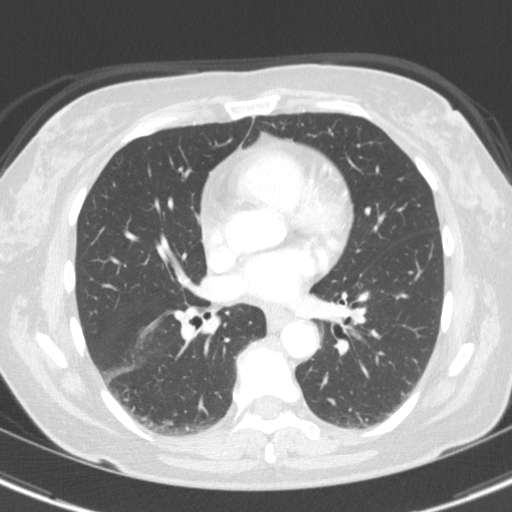
[im 65/130  mediastinal]
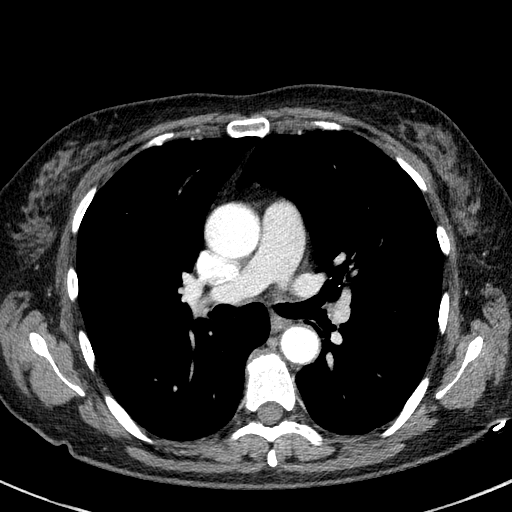
[im 65/130  lung]
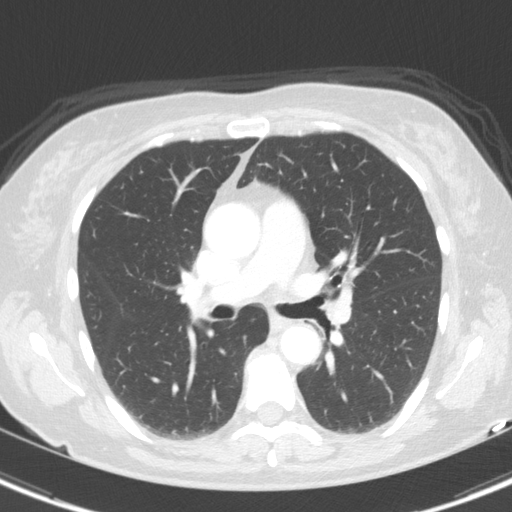
[im 78/130  lung]
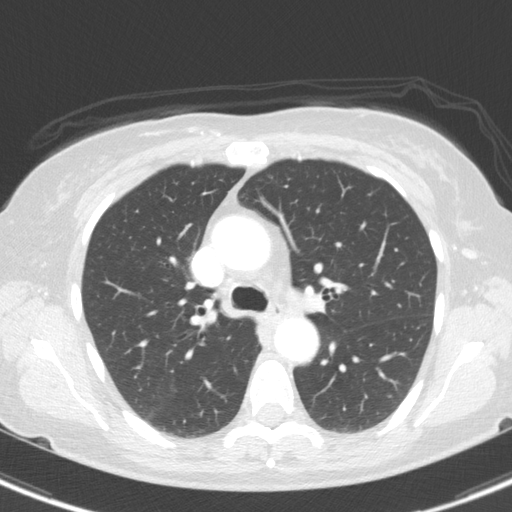
[im 91/130  lung]
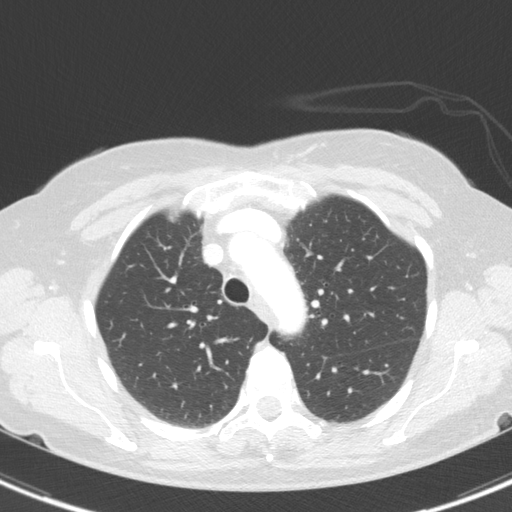
[im 104/130  lung]
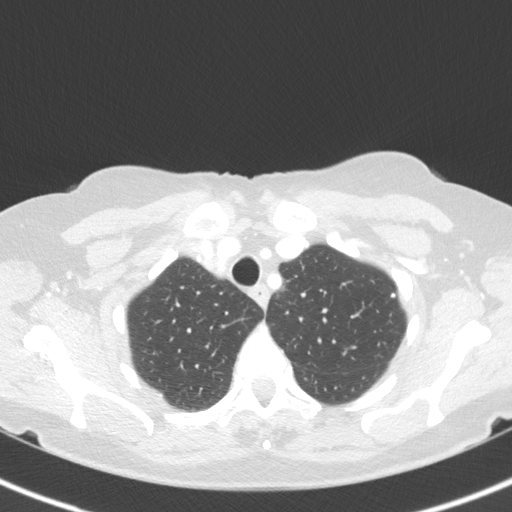
[im 117/130  mediastinal]
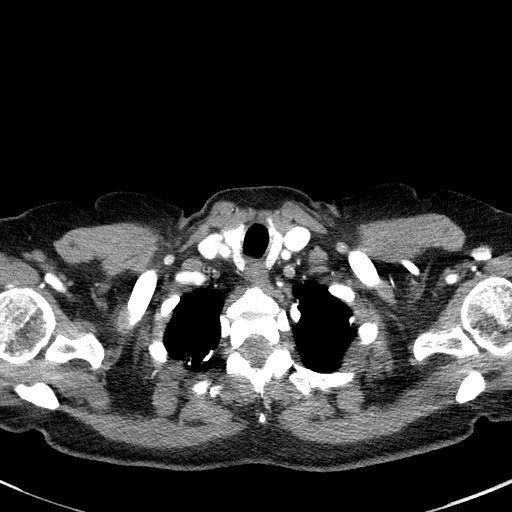
[im 117/130  lung]
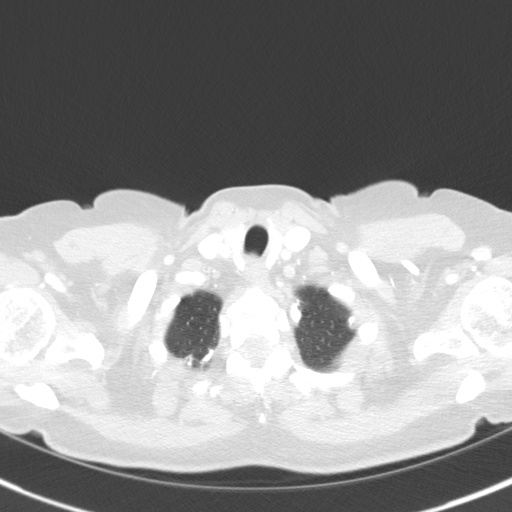

[Series 9: coronal · coronal · 0.55mm/px · 1 of 135 slices shown]
[im 68/135  lung]
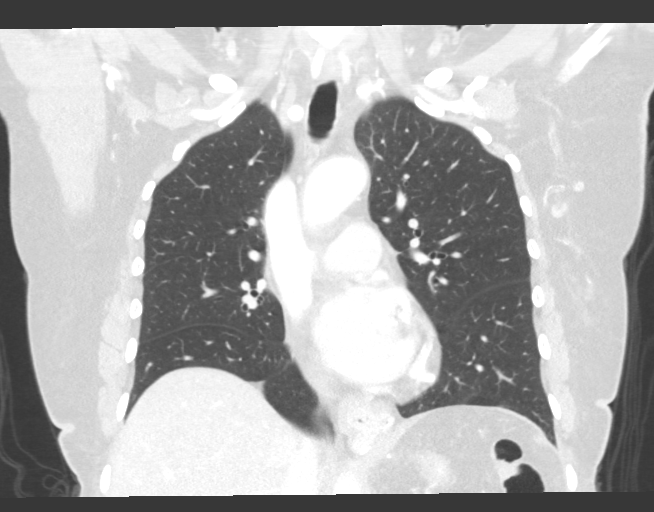

[10 of 36 positions shown; findings below may reference images not displayed]

RADIATION DOSE REDUCTION: This exam was performed according to the
departmental dose-optimization program which includes automated
exposure control, adjustment of the mA and/or kV according to
patient size and/or use of iterative reconstruction technique.

CONTRAST:  75mL OMNIPAQUE IOHEXOL 300 MG/ML  SOLN
FINDINGS: Cardiovascular: Heart normal in size and configuration. Mild left
coronary artery and circumflex coronary artery calcifications. No
pericardial effusion. Great vessels are normal in caliber. Mild
aortic atherosclerosis. No dissection. Arch branch vessels are
widely patent.

Mediastinum/Nodes: Normal thyroid. No neck base, mediastinal or
hilar masses or enlarged lymph nodes. Trachea and esophagus are
unremarkable.

Lungs/Pleura: Pleural base scarring with calcifications at the
apices. Lungs essentially clear. No pleural effusion or
pneumothorax.

Upper Abdomen: Small hiatal hernia. Partly imaged postsurgical
changes from prior gastric surgery. No acute findings in the
visualized upper abdomen.

Musculoskeletal: No fracture or acute finding. No bone lesion. No
chest wall mass.
IMPRESSION: 1. No acute findings in the chest.
2. Mild 2 vessel coronary artery calcifications. Aortic
atherosclerosis.
3. Previous gastric surgery, incompletely imaged. Small hiatal
hernia.

Aortic Atherosclerosis ([id number]-156.6).

## 2022-10-06 ENCOUNTER — Other Ambulatory Visit: Payer: Self-pay | Admitting: Pulmonary Disease

## 2022-10-12 IMAGING — CT CT HEART MORP W/ CTA COR W/ SCORE W/ CA W/CM &/OR W/O CM
4 of 7 series · 8 of 20 positions shown, 9 images · IV contrast (APPLIED)
Comparison: CT chest 06/18/2021
COMPARISON: CT chest 06/18/2021

Addendum:
EXAM:
OVER-READ INTERPRETATION  CT CHEST

The following report is an over-read performed by radiologist Dr.
over-read does not include interpretation of cardiac or coronary
anatomy or pathology. The cardiac CTA/coronary calcium score.
Interpretation by the cardiologist is attached.
HISTORY: Chest pain, nonspecific Dyspnea on exertion (STATEN)
Cardiac/Coronary  CT
TECHNIQUE: The patient was scanned on a Siemens Force scanner.
PROTOCOL: A 120 kV prospective scan was triggered in the descending thoracic
aorta at 111 HU's. Axial non-contrast 3 mm slices were carried out
through the heart. The data set was analyzed on a dedicated work
station and scored using the Agatston method. Gantry rotation speed
was 250 msecs and collimation was .6 mm. Beta blockade and 0.8 mg of
sl NTG was given. The 3D data set was reconstructed in 5% intervals
of the 35-75 % of the R-R cycle. Systolic and diastolic phases were
analyzed on a dedicated work station using MPR, MIP and VRT modes.
The patient received 100mL OMNIPAQUE IOHEXOL 350 MG/ML SOLN
contrast.

[Series 6: ts diast sharp · axial · 0.39mm/px · z∈[+1055,+1089]mm · 2 of 256 slices shown]
[im 86/256  lung]
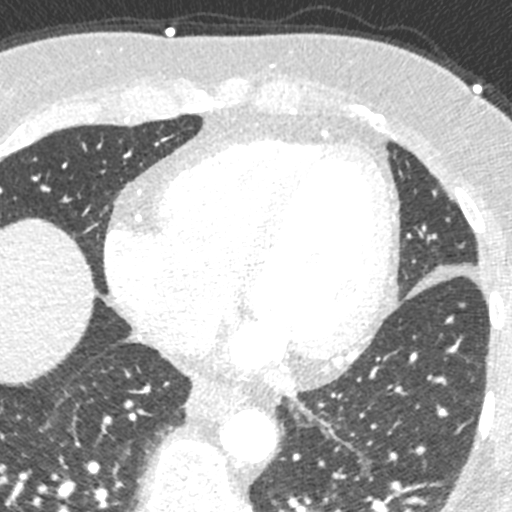
[im 171/256  lung]
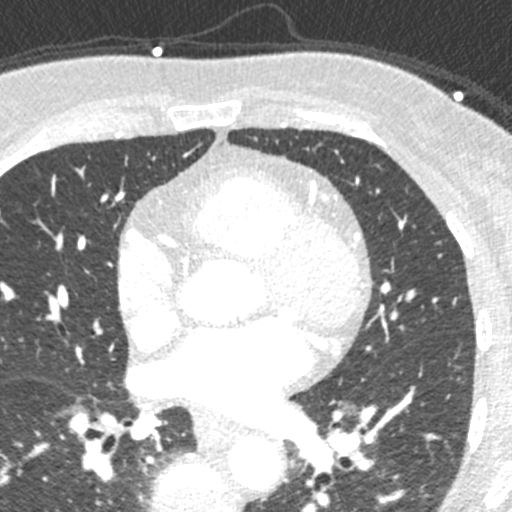

[Series 7: ts syst sharp · axial · 0.39mm/px · z∈[+1055,+1089]mm · 2 of 256 slices shown]
[im 86/256  lung]
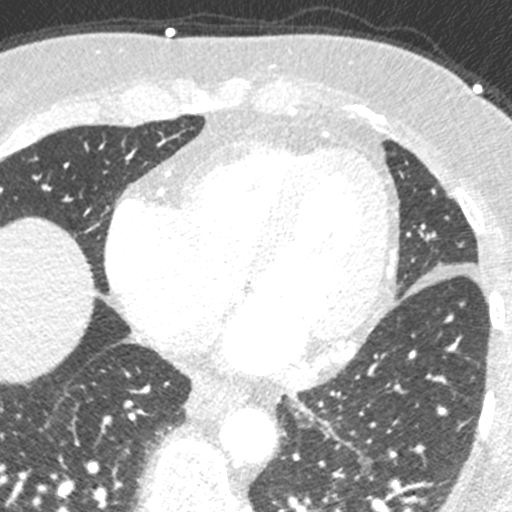
[im 171/256  lung]
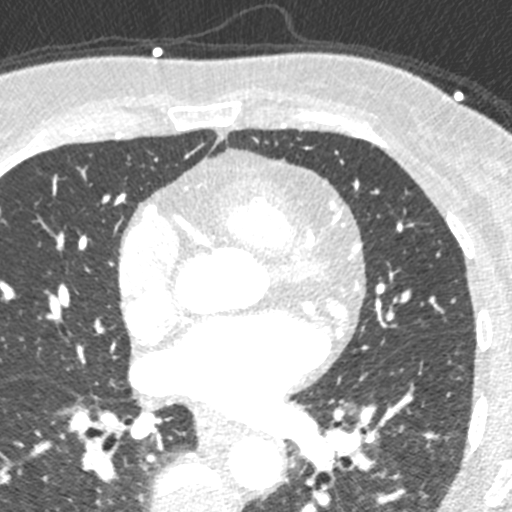

[Series 8: best syst · axial · 0.39mm/px · z∈[+1055,+1089]mm · 2 of 256 slices shown, 3 images]
[im 86/256  vessel]
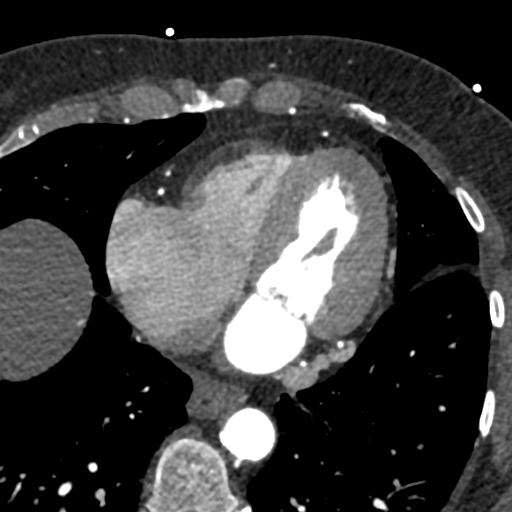
[im 86/256  lung]
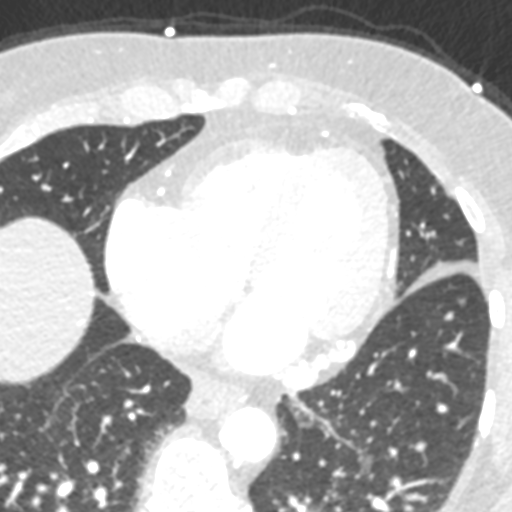
[im 171/256  vessel]
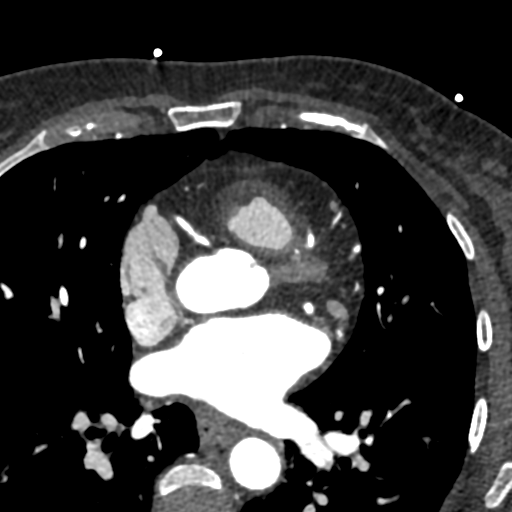

[Series 9: best diast · axial · 0.39mm/px · z∈[+1055,+1089]mm · 2 of 256 slices shown]
[im 86/256  vessel]
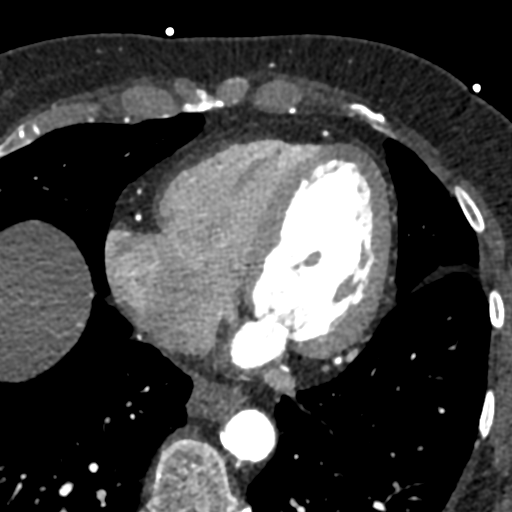
[im 171/256  vessel]
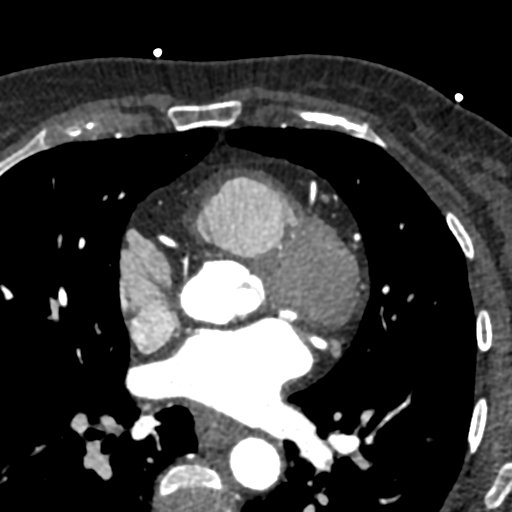

[8 of 20 positions shown; findings below may reference images not displayed]

FINDINGS: Vascular: No acute abnormality.

Mediastinum/Nodes: No adenopathy or mass identified. There is a
small hiatal hernia identified.

Lungs/Pleura: No pleural effusion, airspace consolidation,
atelectasis or pneumothorax visualized. No suspicious lung nodules.

Upper Abdomen: No acute abnormality.

Musculoskeletal: No chest wall mass or suspicious bone lesions
identified.
IMPRESSION: 1. No significant noncardiac supplemental findings identified.
2. Small hiatal hernia.
FINDINGS: Image quality: Good

Noise artifact is: Limited

Coronary calcium score is 51.2, which places the patient in the 70th
percentile for age and sex matched control.

Coronary arteries: Normal coronary origins.  Co-dominance.

Right Coronary Artery: No detectable plaque or stenosis.

Left Main Coronary Artery: No detectable plaque or stenosis.

Left Anterior Descending Coronary Artery: Minimal mixed
atherosclerotic plaque in the proximal LAD, <25% stenosis.

Left Circumflex Artery: Minimal atherosclerotic plaque in the ostial
LCx and minimal mixed atherosclerotic plaque in the proximal LCx,
<25% stenosis.

Aorta: Normal size, 32 mm at the mid ascending aorta (level of the
PA bifurcation) measured double oblique. Mild calcifications. No
dissection.

Aortic Valve: No calcifications.  Tricuspid aortic valve.

Other findings:

Normal pulmonary vein drainage into the left atrium.

Normal left atrial appendage without thrombus.

Normal size of the pulmonary artery.
IMPRESSION: 1. Minimal CAD, CADRADS = 1.

2. Coronary calcium score is 51.2, which places the patient in the
70th percentile for age and sex matched control.

3. Normal coronary origins with co-dominance.

*** End of Addendum ***
EXAM:
OVER-READ INTERPRETATION  CT CHEST

The following report is an over-read performed by radiologist Dr.
over-read does not include interpretation of cardiac or coronary
anatomy or pathology. The cardiac CTA/coronary calcium score.
Interpretation by the cardiologist is attached.
FINDINGS: Vascular: No acute abnormality.

Mediastinum/Nodes: No adenopathy or mass identified. There is a
small hiatal hernia identified.

Lungs/Pleura: No pleural effusion, airspace consolidation,
atelectasis or pneumothorax visualized. No suspicious lung nodules.

Upper Abdomen: No acute abnormality.

Musculoskeletal: No chest wall mass or suspicious bone lesions
identified.
IMPRESSION: 1. No significant noncardiac supplemental findings identified.
2. Small hiatal hernia.

## 2023-01-24 ENCOUNTER — Other Ambulatory Visit: Payer: Self-pay | Admitting: Otolaryngology

## 2023-01-25 LAB — SURGICAL PATHOLOGY

## 2023-12-21 ENCOUNTER — Other Ambulatory Visit: Payer: Self-pay | Admitting: Pulmonary Disease
